# Patient Record
Sex: Female | Born: 1960 | Hispanic: No | Marital: Married | Smoking: Never smoker
Health system: Southern US, Community
[De-identification: ages and names within clinical notes are randomized; demographics above are authoritative.]

## PROBLEM LIST (undated history)

## (undated) DIAGNOSIS — H409 Unspecified glaucoma: Secondary | ICD-10-CM

## (undated) DIAGNOSIS — I10 Essential (primary) hypertension: Secondary | ICD-10-CM

## (undated) DIAGNOSIS — I429 Cardiomyopathy, unspecified: Secondary | ICD-10-CM

## (undated) DIAGNOSIS — I509 Heart failure, unspecified: Secondary | ICD-10-CM

## (undated) DIAGNOSIS — Z8616 Personal history of COVID-19: Secondary | ICD-10-CM

## (undated) DIAGNOSIS — J45909 Unspecified asthma, uncomplicated: Secondary | ICD-10-CM

## (undated) DIAGNOSIS — G43909 Migraine, unspecified, not intractable, without status migrainosus: Secondary | ICD-10-CM

## (undated) HISTORY — DX: Essential (primary) hypertension: I10

## (undated) HISTORY — DX: Personal history of COVID-19: Z86.16

## (undated) HISTORY — DX: Heart failure, unspecified: I50.9

## (undated) HISTORY — DX: Migraine, unspecified, not intractable, without status migrainosus: G43.909

## (undated) HISTORY — DX: Cardiomyopathy, unspecified: I42.9

## (undated) HISTORY — PX: EYE SURGERY: SHX253

## (undated) HISTORY — DX: Unspecified glaucoma: H40.9

---

## 2000-06-11 ENCOUNTER — Other Ambulatory Visit: Admission: RE | Admit: 2000-06-11 | Discharge: 2000-06-11 | Payer: Self-pay | Admitting: *Deleted

## 2011-08-03 ENCOUNTER — Ambulatory Visit (INDEPENDENT_AMBULATORY_CARE_PROVIDER_SITE_OTHER): Payer: 59 | Admitting: Family Medicine

## 2011-08-03 VITALS — BP 109/71 | HR 73 | Temp 98.0°F | Resp 16

## 2011-08-03 DIAGNOSIS — M2550 Pain in unspecified joint: Secondary | ICD-10-CM

## 2011-08-03 DIAGNOSIS — M6740 Ganglion, unspecified site: Secondary | ICD-10-CM

## 2011-08-03 NOTE — Patient Instructions (Signed)
Recheck if the lesion does not go down. Let me know if any concern of infection.

## 2011-08-03 NOTE — Progress Notes (Signed)
Patient is here for me to recheck her footwear I removed the fluid from a ganglion cyst last year. it did okay for a while but has now come back and she wanted me to recheck it. It's not getting a lot of pain, but is just Bayer.  Objective: 1 CM ganglion cyst on the lateral aspect of left foot overlying the first metatarsal head.   Assessment: Recurrent ganglion cyst  Plan: Discussed treatment of this. Patient would like me to turn reaspirated.  Using sterile technique the lesion was numbed with 1% lidocaine. Aspiration was attempted using an 18-gauge needle. Despite several passes was not able to give any cystic fluid out, no definite feels like cyst and little bit of the gelatinous material trauma output on one of the sticks with the needle. Could not squeeze anything the whole. Injected with Depo-Medrol approximately 0.2 cc or 16 milligrams.  Patient tolerated the procedure well.  Let me know if it doesn't go down.

## 2011-08-15 ENCOUNTER — Ambulatory Visit (INDEPENDENT_AMBULATORY_CARE_PROVIDER_SITE_OTHER): Payer: 59 | Admitting: Physician Assistant

## 2011-08-15 VITALS — BP 131/86 | HR 67 | Temp 98.5°F | Resp 16 | Ht 63.25 in | Wt 187.2 lb

## 2011-08-15 DIAGNOSIS — G43909 Migraine, unspecified, not intractable, without status migrainosus: Secondary | ICD-10-CM

## 2011-08-15 DIAGNOSIS — R110 Nausea: Secondary | ICD-10-CM

## 2011-08-15 DIAGNOSIS — N920 Excessive and frequent menstruation with regular cycle: Secondary | ICD-10-CM

## 2011-08-15 LAB — POCT CBC
Granulocyte percent: 59.4 %G (ref 37–80)
HCT, POC: 39.8 % (ref 37.7–47.9)
Hemoglobin: 12.7 g/dL (ref 12.2–16.2)
Lymph, poc: 2.5 (ref 0.6–3.4)
MCH, POC: 29.5 pg (ref 27–31.2)
MCHC: 31.9 g/dL (ref 31.8–35.4)
MCV: 92.4 fL (ref 80–97)
MID (cbc): 0.5 (ref 0–0.9)
MPV: 8.6 fL (ref 0–99.8)
POC Granulocyte: 4.5 (ref 2–6.9)
POC LYMPH PERCENT: 33.3 %L (ref 10–50)
POC MID %: 7.3 %M (ref 0–12)
Platelet Count, POC: 380 10*3/uL (ref 142–424)
RBC: 4.31 M/uL (ref 4.04–5.48)
RDW, POC: 13.4 %
WBC: 7.5 10*3/uL (ref 4.6–10.2)

## 2011-08-15 MED ORDER — PROMETHAZINE HCL 12.5 MG PO TABS: 12.5000 mg | ORAL_TABLET | Freq: Three times a day (TID) | ORAL | Status: DC | PRN

## 2011-08-15 MED ORDER — KETOROLAC TROMETHAMINE 60 MG/2ML IM SOLN
60.0000 mg | Freq: Once | INTRAMUSCULAR | Status: AC
  Administered 2011-08-15: 60 mg via INTRAMUSCULAR

## 2011-08-15 NOTE — Progress Notes (Signed)
  Subjective:    Patient ID: Marie Porter, female    DOB: 1960-10-05, 51 y.o.   MRN: [id number]  HPI Patient complains of migraine headache since last night. Says this feels like a typical migraine, although she has not had one for "years." Admits to increased stress recently and says that could have attributed to this headache. Admits to photophobia, phonophobia, slight nausea and vomiting yesterday.  She took Aleve yesterday which helped and tylenol today.  In addition, she has had increasingly heavy periods. She has a history of heavy periods when she was younger, but has had normal periods for the last few years. This period started 3 days ago and is very heavy, so she is concerned about anemia as well.     Review of Systems  All other systems reviewed and are negative.       Objective:   Physical Exam  Constitutional: She is oriented to person, place, and time. She appears well-developed and well-nourished.  HENT:  Head: Normocephalic and atraumatic.  Right Ear: Hearing, tympanic membrane, external ear and ear canal normal.  Left Ear: Hearing, tympanic membrane, external ear and ear canal normal.  Eyes: Conjunctivae and EOM are normal. Pupils are equal, round, and reactive to light.  Neck: Normal range of motion.  Cardiovascular: Normal rate, regular rhythm and normal heart sounds.   Pulmonary/Chest: Effort normal and breath sounds normal.  Lymphadenopathy:    She has no cervical adenopathy.  Neurological: She is alert and oriented to person, place, and time. No cranial nerve deficit.  Psychiatric: She has a normal mood and affect. Her behavior is normal. Judgment and thought content normal.          Assessment & Plan:    1. Migraine Toradol given today. Take Phenergan as needed for nause  ketorolac (TORADOL) injection 60 mg  2. Menorrhagia  Recommend follow up with GYN to further evaluate and treat menorrhagia.  POCT CBC  3. Nausea

## 2011-09-20 ENCOUNTER — Ambulatory Visit (INDEPENDENT_AMBULATORY_CARE_PROVIDER_SITE_OTHER): Payer: 59 | Admitting: Emergency Medicine

## 2011-09-20 DIAGNOSIS — N959 Unspecified menopausal and perimenopausal disorder: Secondary | ICD-10-CM

## 2011-09-20 LAB — POCT URINE PREGNANCY: Preg Test, Ur: NEGATIVE

## 2011-09-21 NOTE — Progress Notes (Signed)
  Subjective:    Patient ID: Marie Porter, female    DOB: 1960-10-01, 51 y.o.   MRN: [id number]  HPI Missed period.  Never missed previously.  No complaints   Review of Systems As per HPI, otherwise negative.      Objective:   Physical Exam   GEN: WDWN, NAD, Non-toxic, Alert & Oriented x 3 HEENT: Atraumatic, Normocephalic.  Ears and Nose: No external deformity. EXTR: No clubbing/cyanosis/edema NEURO: Normal gait.  PSYCH: Normally interactive. Conversant. Not depressed or anxious appearing.  Calm demeanor.        Assessment & Plan:  Missed period HCG Negative pregnancy Refer to gyn

## 2011-10-12 ENCOUNTER — Encounter: Payer: Self-pay | Admitting: Family Medicine

## 2011-10-12 ENCOUNTER — Ambulatory Visit (INDEPENDENT_AMBULATORY_CARE_PROVIDER_SITE_OTHER): Payer: 59 | Admitting: Family Medicine

## 2011-10-12 VITALS — BP 110/70 | HR 60 | Temp 98.0°F | Resp 16 | Ht 62.0 in | Wt 180.0 lb

## 2011-10-12 DIAGNOSIS — M6740 Ganglion, unspecified site: Secondary | ICD-10-CM

## 2011-10-12 HISTORY — PX: GANGLION CYST EXCISION: SHX1691

## 2011-10-12 NOTE — Patient Instructions (Signed)
Return in 7-10 days to have sutures removed

## 2011-10-12 NOTE — Progress Notes (Signed)
Subjective: Patient has a recurrence of the ganglion cyst on her left foot. This has been aspirated and injected twice. This last time it lasted for 2 months. She needs to be bothered by it since it is come back and is causing her pressure does cover her foot.  Objective: 1 CM tight ganglion cyst on the lateral aspect of the left foot.  Assessment: Ganglion cyst  Plan Discussed aspiration or attempted excision of this. Says it has occurred several times. I think may be worth trying to excise the flank. It is difficult to excise skin and cyst completely, but at least we can disrupt the part of the capsule. She is agreeable to this.  Procedure note: Wound was prepped and anesthetized with 1% lidocaine. Incision was made over the cyst and gently dissected. Then sister bumped up into the incision, but could not get down underneath it. Finally the ruptured. A portion of capsule was removed. This was sent for pathology. About 2 cc of gelatinous material was expressed. Wound was closed with 6-0 Ethilon x3. She is to come back in about 10 days if the sutures removed. She is to let her know if there is any side effects insists on her foot.

## 2011-10-19 ENCOUNTER — Telehealth: Payer: Self-pay | Admitting: Radiology

## 2011-10-19 NOTE — Telephone Encounter (Signed)
Message copied by Caffie Damme on Thu Oct 19, 2011  1:07 PM ------      Message from: HOPPER, DAVID H      Created: Mon Oct 16, 2011  9:43 PM       Call Angie and tell her the pathology confirmed cyst tissue.  I hope I got enough it does not return.

## 2011-10-19 NOTE — Telephone Encounter (Signed)
I left mssg for her to call me back about pathology

## 2011-10-20 ENCOUNTER — Ambulatory Visit (INDEPENDENT_AMBULATORY_CARE_PROVIDER_SITE_OTHER): Payer: 59 | Admitting: Physician Assistant

## 2011-10-20 ENCOUNTER — Encounter: Payer: Self-pay | Admitting: Physician Assistant

## 2011-10-20 DIAGNOSIS — M6740 Ganglion, unspecified site: Secondary | ICD-10-CM

## 2011-10-20 NOTE — Progress Notes (Signed)
The patient presents for suture removal.  A ganglion cyst was removed from the lateral aspect of the left foot by Dr. Alwyn Ren about a week ago. Path report confirmed the diagnosis.  No problems or concerns.  Well-healed surgical wound.  Suture removal without incident.

## 2011-10-20 NOTE — Telephone Encounter (Signed)
Pt informed of results and voiced understanding

## 2012-05-07 ENCOUNTER — Ambulatory Visit (INDEPENDENT_AMBULATORY_CARE_PROVIDER_SITE_OTHER): Payer: 59 | Admitting: Family Medicine

## 2012-05-07 ENCOUNTER — Ambulatory Visit: Payer: 59

## 2012-05-07 DIAGNOSIS — M542 Cervicalgia: Secondary | ICD-10-CM
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
## 2012-05-11 ENCOUNTER — Ambulatory Visit (INDEPENDENT_AMBULATORY_CARE_PROVIDER_SITE_OTHER): Payer: 59 | Admitting: Physician Assistant

## 2012-05-11 ENCOUNTER — Encounter: Payer: Self-pay | Admitting: Physician Assistant

## 2012-05-11 VITALS — BP 128/79 | HR 73 | Temp 97.6°F | Resp 16 | Ht 63.0 in | Wt 178.2 lb

## 2012-05-11 DIAGNOSIS — G43909 Migraine, unspecified, not intractable, without status migrainosus: Secondary | ICD-10-CM

## 2012-05-11 MED ORDER — KETOROLAC TROMETHAMINE 60 MG/2ML IM SOLN
60.0000 mg | Freq: Once | INTRAMUSCULAR | Status: AC
  Administered 2012-05-11: 60 mg via INTRAMUSCULAR

## 2012-05-11 MED ORDER — ONDANSETRON 4 MG PO TBDP
4.0000 mg | ORAL_TABLET | Freq: Once | ORAL | Status: AC
  Administered 2012-05-11: 4 mg via ORAL
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
## 2012-05-11 NOTE — Patient Instructions (Addendum)
Migraine Headache A migraine headache is an intense, throbbing pain on one or both sides of your head. A migraine can last for 30 minutes to several hours. CAUSES  The exact cause of a migraine headache is not always known. However, a migraine may be caused when nerves in the brain become irritated and release chemicals that cause inflammation. This causes pain. SYMPTOMS  Pain on one or both sides of your head.  Pulsating or throbbing pain.  Severe pain that prevents daily activities.  Pain that is aggravated by any physical activity.  Nausea, vomiting, or both.  Dizziness.  Pain with exposure to bright lights, loud noises, or activity.  General sensitivity to bright lights, loud noises, or smells. Before you get a migraine, you may get warning signs that a migraine is coming (aura). An aura may include:  Seeing flashing lights.  Seeing bright spots, halos, or zig-zag lines.  Having tunnel vision or blurred vision.  Having feelings of numbness or tingling.  Having trouble talking.  Having muscle weakness. MIGRAINE TRIGGERS  Alcohol.  Smoking.  Stress.  Menstruation.  Aged cheeses.  Foods or drinks that contain nitrates, glutamate, aspartame, or tyramine.  Lack of sleep.  Chocolate.  Caffeine.  Hunger.  Physical exertion.  Fatigue.  Medicines used to treat chest pain (nitroglycerine), birth control pills, estrogen, and some blood pressure medicines. DIAGNOSIS  A migraine headache is often diagnosed based on:  Symptoms.  Physical examination.  A CT scan or MRI of your head. TREATMENT Medicines may be given for pain and nausea. Medicines can also be given to help prevent recurrent migraines.  HOME CARE INSTRUCTIONS  Only take over-the-counter or prescription medicines for pain or discomfort as directed by your caregiver. The use of long-term narcotics is not recommended.  Lie down in a dark, quiet room when you have a migraine.  Keep a journal  to find out what may trigger your migraine headaches. For example, write down:  What you eat and drink.  How much sleep you get.  Any change to your diet or medicines.  Limit alcohol consumption.  Quit smoking if you smoke.  Get 7 to 9 hours of sleep, or as recommended by your caregiver.  Limit stress.  Keep lights dim if bright lights bother you and make your migraines worse. SEEK IMMEDIATE MEDICAL CARE IF:   Your migraine becomes severe.  You have a fever.  You have a stiff neck.  You have vision loss.  You have muscular weakness or loss of muscle control.  You start losing your balance or have trouble walking.  You feel faint or pass out.  You have severe symptoms that are different from your first symptoms. MAKE SURE YOU:   Understand these instructions.  Will watch your condition.  Will get help right away if you are not doing well or get worse. Document Released: 02/06/2005 Document Revised: 05/01/2011 Document Reviewed: 01/27/2011 [employer] Patient Information 2013 [employer].  

## 2012-08-13 ENCOUNTER — Ambulatory Visit (INDEPENDENT_AMBULATORY_CARE_PROVIDER_SITE_OTHER): Payer: 59 | Admitting: Physician Assistant

## 2012-08-13 VITALS — BP 129/74 | HR 80 | Temp 97.7°F | Resp 18 | Ht 63.0 in | Wt 184.0 lb

## 2012-08-13 DIAGNOSIS — R109 Unspecified abdominal pain: Secondary | ICD-10-CM

## 2012-08-13 DIAGNOSIS — R635 Abnormal weight gain: Secondary | ICD-10-CM

## 2012-08-13 LAB — POCT URINALYSIS DIPSTICK
Bilirubin, UA: NEGATIVE
Blood, UA: NEGATIVE
Glucose, UA: NEGATIVE
Spec Grav, UA: 1.025

## 2012-08-13 LAB — POCT CBC
Granulocyte percent: 67.6 % (ref 37–80)
HCT, POC: 39 % (ref 37.7–47.9)
Hemoglobin: 12.4 g/dL (ref 12.2–16.2)
Lymph, poc: 2.2 (ref 0.6–3.4)
MCH, POC: 30.8 pg (ref 27–31.2)
MCHC: 31.8 g/dL (ref 31.8–35.4)
MCV: 96.7 fL (ref 80–97)
MID (cbc): 0.6 (ref 0–0.9)
MPV: 9.1 fL (ref 0–99.8)
POC Granulocyte: 5.8 (ref 2–6.9)
POC LYMPH PERCENT: 25.4 % (ref 10–50)
POC MID %: 7 % (ref 0–12)
Platelet Count, POC: 305 10*3/uL (ref 142–424)
RBC: 4.03 M/uL — AB (ref 4.04–5.48)
RDW, POC: 13.7 %
WBC: 8.6 10*3/uL (ref 4.6–10.2)

## 2012-08-13 LAB — POCT UA - MICROSCOPIC ONLY
Mucus, UA: NEGATIVE
RBC, urine, microscopic: NEGATIVE
WBC, Ur, HPF, POC: NEGATIVE

## 2012-08-13 LAB — COMPREHENSIVE METABOLIC PANEL
ALT: 15 U/L (ref 0–35)
Albumin: 3.7 g/dL (ref 3.5–5.2)
Alkaline Phosphatase: 74 U/L (ref 39–117)
CO2: 26 mEq/L (ref 19–32)
Glucose, Bld: 79 mg/dL (ref 70–99)
Potassium: 3.6 mEq/L (ref 3.5–5.3)
Sodium: 139 mEq/L (ref 135–145)
Total Protein: 6.2 g/dL (ref 6.0–8.3)

## 2012-08-13 LAB — POCT URINE PREGNANCY: Preg Test, Ur: NEGATIVE

## 2012-08-13 NOTE — Progress Notes (Signed)
Patient ID: Marie Porter MRN: [id number], DOB: [birth year], 52 y.o. Date of Encounter: 08/13/2012, 12:21 PM  Primary Physician: No primary provider on file.  Chief Complaint: Abdominal pain x 2 weeks  HPI: 52 y.o. female with history below presents with generalized abdominal pain and cramping x 2 weeks. Pain is constant dull ache described as a cramp. She states it is "box like over the entire abdomen." Pain is greatest along the mid line. She states nothing makes it worse and nothing makes it better. States it is not improved or worsened with food. It is rated a 5 or 6/10, and will go up to a 7 or 8/10 lasting 5-10 minutes before going back down to the baseline of 5-6/10. She has been afebrile throughout. She was due for her period in mid June and wonders if this could be secondary to perimenopause. Her husband is gone most of the time secondary to work and states she is not pregnant. Some associated nausea without emesis. No diarrhea or constipation. She will have 2-3 regular formed BM's per day and that is baseline for her throughout her life. No urinary symptoms. No vaginal symptoms outside of the lack of her period that was due in mid June. She states she has had some cramping into her back which is typical.   No past medical history on file.   Home Meds: Prior to Admission medications   Medication Sig Start Date End Date Taking? Authorizing Provider  Naproxen Sodium (ALEVE PO) Take by mouth.   Yes Historical Provider, MD  promethazine (PHENERGAN) 12.5 MG tablet Take 1 tablet (12.5 mg total) by mouth every 8 (eight) hours as needed for nausea. 08/15/11 08/22/11  Nelva Nay, PA-C    Allergies: No Known Allergies  History   Social History  . Marital Status: Unknown    Spouse Name: N/A    Number of Children: N/A  . Years of Education: N/A   Occupational History  . Not on file.   Social History Main Topics  . Smoking status: Never Smoker   . Smokeless tobacco: Not on file    . Alcohol Use: 0.6 oz/week    1 Glasses of wine per week  . Drug Use: No  . Sexually Active: Yes   Other Topics Concern  . Not on file   Social History Narrative  . No narrative on file     Review of Systems: Constitutional: positive for fatigue. negative for chills, or fever  HEENT: negative for vision changes, hearing loss, congestion, rhinorrhea, ST, epistaxis, or sinus pressure Cardiovascular: negative for chest pain, chest tightness, DOE, edema, or palpitations Respiratory: negative for hemoptysis, wheezing, shortness of breath, or cough Abdominal: see above Genitourinary: negative for dysuria, urinary frequency, or urinary urgency  Vaginal: negative for abnormal bleeding, burning, or discharge Dermatological: negative for rash Neurologic: negative for headache, dizziness, or syncope   Physical Exam: Blood pressure 129/74, pulse 80, temperature 97.7 F (36.5 C), resp. rate 18, height 5\' 3"  (1.6 m), weight 184 lb (83.462 kg), last menstrual period 07/04/2012., Body mass index is 32.6 kg/(m^2). General: Well developed, well nourished, in no acute distress. Well appearing.  Head: Normocephalic, atraumatic, eyes without discharge, sclera non-icteric, nares are without discharge. Bilateral auditory canals clear, TM's are without perforation, pearly grey and translucent with reflective cone of light bilaterally. Oral cavity moist, posterior pharynx without exudate, erythema, peritonsillar abscess, or post nasal drip.  Neck: Supple. No thyromegaly. Full ROM. No lymphadenopathy. Lungs: Clear bilaterally to auscultation without  wheezes, rales, or rhonchi. Breathing is unlabored. Heart: RRR with S1 S2. No murmurs, rubs, or gallops appreciated. Abdomen: Soft, non-distended with normoactive bowel sounds. Mild diffuse TTP with greatest TTP along RUQ and RLQ, although this was still mild. No hepatosplenomegaly. No rebound/guarding. No obvious abdominal masses. Negative McBurney's. Negative  table jar test.  Msk:  Strength and tone normal for age. Extremities/Skin: Warm and dry. No clubbing or cyanosis. No edema. No rashes or suspicious lesions. Neuro: Alert and oriented X 3. Moves all extremities spontaneously. Gait is normal. CNII-XII grossly in tact. Psych:  Responds to questions appropriately with a normal affect.   Labs: Results for orders placed in visit on 08/13/12  POCT CBC      Result Value Range   WBC 8.6  4.6 - 10.2 K/uL   Lymph, poc 2.2  0.6 - 3.4   POC LYMPH PERCENT 25.4  10 - 50 %L   MID (cbc) 0.6  0 - 0.9   POC MID % 7.0  0 - 12 %M   POC Granulocyte 5.8  2 - 6.9   Granulocyte percent 67.6  37 - 80 %G   RBC 4.03 (*) 4.04 - 5.48 M/uL   Hemoglobin 12.4  12.2 - 16.2 g/dL   HCT, POC 13.0  86.5 - 47.9 %   MCV 96.7  80 - 97 fL   MCH, POC 30.8  27 - 31.2 pg   MCHC 31.8  31.8 - 35.4 g/dL   RDW, POC 78.4     Platelet Count, POC 305  142 - 424 K/uL   MPV 9.1  0 - 99.8 fL  POCT UA - MICROSCOPIC ONLY      Result Value Range   WBC, Ur, HPF, POC neg     RBC, urine, microscopic neg     Bacteria, U Microscopic 1+     Mucus, UA neg     Epithelial cells, urine per micros 2-5     Crystals, Ur, HPF, POC neg     Casts, Ur, LPF, POC neg     Yeast, UA neg    POCT URINALYSIS DIPSTICK      Result Value Range   Color, UA yellow     Clarity, UA cloudy     Glucose, UA neg     Bilirubin, UA neg     Ketones, UA neg     Spec Grav, UA 1.025     Blood, UA neg     pH, UA 6.0     Protein, UA neg     Urobilinogen, UA 0.2     Nitrite, UA neg     Leukocytes, UA Negative    POCT URINE PREGNANCY      Result Value Range   Preg Test, Ur Negative      CMP, TSH, FSH, and LH all pending  ASSESSMENT AND PLAN:  52 y.o. female with abdominal cramping likely secondary to perimenopause  -Motrin  -Tylenol  -Prilosec OTC -Probiotics -Await labs -Will hold on imaging at this time given symptom duration, exam, and lab findings. If symptoms persist will proceed with imaging.    -Discussed with Dr. Milus Glazier   Signed, Eula Listen, PA-C 08/13/2012 12:21 PM

## 2012-08-14 LAB — FOLLICLE STIMULATING HORMONE: FSH: 3.8 m[IU]/mL

## 2012-08-14 LAB — LUTEINIZING HORMONE: LH: 20.9 m[IU]/mL

## 2012-12-31 ENCOUNTER — Encounter: Payer: Self-pay | Admitting: Physician Assistant

## 2012-12-31 ENCOUNTER — Ambulatory Visit (INDEPENDENT_AMBULATORY_CARE_PROVIDER_SITE_OTHER): Payer: 59 | Admitting: Physician Assistant

## 2012-12-31 VITALS — BP 114/70 | HR 76 | Temp 98.0°F | Resp 18 | Ht 62.5 in | Wt 174.0 lb

## 2012-12-31 DIAGNOSIS — Z1239 Encounter for other screening for malignant neoplasm of breast: Secondary | ICD-10-CM

## 2012-12-31 DIAGNOSIS — Z0000 Encounter for general adult medical examination without abnormal findings: Secondary | ICD-10-CM

## 2012-12-31 DIAGNOSIS — Z1211 Encounter for screening for malignant neoplasm of colon: Secondary | ICD-10-CM

## 2012-12-31 DIAGNOSIS — G43909 Migraine, unspecified, not intractable, without status migrainosus: Secondary | ICD-10-CM | POA: Insufficient documentation

## 2012-12-31 DIAGNOSIS — Z124 Encounter for screening for malignant neoplasm of cervix: Secondary | ICD-10-CM

## 2012-12-31 LAB — LIPID PANEL
Cholesterol: 128 mg/dL (ref 0–200)
LDL Cholesterol: 67 mg/dL (ref 0–99)
Total CHOL/HDL Ratio: 2.6 Ratio
VLDL: 11 mg/dL (ref 0–40)

## 2012-12-31 LAB — POCT URINALYSIS DIPSTICK
Ketones, UA: NEGATIVE
Leukocytes, UA: NEGATIVE
Nitrite, UA: NEGATIVE
Protein, UA: NEGATIVE
Urobilinogen, UA: 0.2
pH, UA: 7

## 2012-12-31 LAB — POCT CBC
Granulocyte percent: 71.4 %G (ref 37–80)
HCT, POC: 41 % (ref 37.7–47.9)
MCHC: 32 g/dL (ref 31.8–35.4)
MPV: 8.7 fL (ref 0–99.8)
POC Granulocyte: 5.4 (ref 2–6.9)
POC LYMPH PERCENT: 23.5 %L (ref 10–50)
POC MID %: 5.1 %M (ref 0–12)
RDW, POC: 13.3 %

## 2012-12-31 LAB — COMPREHENSIVE METABOLIC PANEL
ALT: 14 U/L (ref 0–35)
AST: 15 U/L (ref 0–37)
Alkaline Phosphatase: 69 U/L (ref 39–117)
Creat: 0.68 mg/dL (ref 0.50–1.10)
Sodium: 141 mEq/L (ref 135–145)
Total Bilirubin: 0.5 mg/dL (ref 0.3–1.2)
Total Protein: 6.5 g/dL (ref 6.0–8.3)

## 2012-12-31 NOTE — Patient Instructions (Addendum)
I will contact you with your lab results as soon as they are available.   If you have not heard from me in 2 weeks, please contact me.  The fastest way to get your results is to register for My Chart (see the instructions on the last page of this printout).  Keeping You Healthy  Get These Tests  Blood Pressure- Have your blood pressure checked by your healthcare provider at least once a year.  Normal blood pressure is 120/80.  Weight- Have your body mass index (BMI) calculated to screen for obesity.  BMI is a measure of body fat based on height and weight.  You can calculate your own BMI at www.nhlbisupport.com/bmi/  Cholesterol- Have your cholesterol checked every year.  Diabetes- Have your blood sugar checked every year if you have high blood pressure, high cholesterol, a family history of diabetes or if you are overweight.  Pap Smear- Have a pap smear every 1 to 3 years if you have been sexually active.  If you are older than 65 and recent pap smears have been normal you may not need additional pap smears.  In addition, if you have had a hysterectomy  For benign disease additional pap smears are not necessary.  Mammogram-Yearly mammograms are essential for early detection of breast cancer  Screening for Colon Cancer- Colonoscopy starting at age 50. Screening may begin sooner depending on your family history and other health conditions.  Follow up colonoscopy as directed by your Gastroenterologist.  Screening for Osteoporosis- Screening begins at age 65 with bone density scanning, sooner if you are at higher risk for developing Osteoporosis.  Get these medicines  Calcium with Vitamin D- Your body requires 1200-1500 mg of Calcium a day and 800-1000 IU of Vitamin D a day.  You can only absorb 500 mg of Calcium at a time therefore Calcium must be taken in 2 or 3 separate doses throughout the day.  Hormones- Hormone therapy has been associated with increased risk for certain cancers and  heart disease.  Talk to your healthcare provider about if you need relief from menopausal symptoms.  Aspirin- Ask your healthcare provider about taking Aspirin to prevent Heart Disease and Stroke.  Get these Immuniztions  Flu shot- Every fall  Pneumonia shot- Once after the age of 65; if you are younger ask your healthcare provider if you need a pneumonia shot.  Tetanus- Every ten years.  Zostavax- Once after the age of 60 to prevent shingles.  Take these steps  Don't smoke- Your healthcare provider can help you quit. For tips on how to quit, ask your healthcare provider or go to www.smokefree.gov or call 1-800 QUIT-NOW.  Be physically active- Exercise 5 days a week for a minimum of 30 minutes.  If you are not already physically active, start slow and gradually work up to 30 minutes of moderate physical activity.  Try walking, dancing, bike riding, swimming, etc.  Eat a healthy diet- Eat a variety of healthy foods such as fruits, vegetables, whole grains, low fat milk, low fat cheeses, yogurt, lean meats, chicken, fish, eggs, dried beans, tofu, etc.  For more information go to www.thenutritionsource.org  Dental visit- Brush and floss teeth twice daily; visit your dentist twice a year.  Eye exam- Visit your Optometrist or Ophthalmologist yearly.  Drink alcohol in moderation- Limit alcohol intake to one drink or less a day.  Never drink and drive.  Depression- Your emotional health is as important as your physical health.  If you're feeling   down or losing interest in things you normally enjoy, please talk to your healthcare provider.  Seat Belts- can save your life; always wear one  Smoke/Carbon Monoxide detectors- These detectors need to be installed on the appropriate level of your home.  Replace batteries at least once a year.  Violence- If anyone is threatening or hurting you, please tell your healthcare provider.  Living Will/ Health care power of attorney- Discuss with your  healthcare provider and family. 

## 2012-12-31 NOTE — Progress Notes (Signed)
Subjective:    Patient ID: Marie Porter, female    DOB: [birth year], 52 y.o.   MRN: [id number]  HPI  Marie Porter is a 52 YO female who presents for annual physical exam.  She has a history of migraines and glaucoma but desires to be up to date with health screening tests such as mammography, pap smear, and colonoscopy.  The patient has never had a colonoscopy or mammogram and is interested in scheduling these, but denies any symptoms including rectal bleeding, hemorrhoids, bloody stools, abdominal pain, breast masses, nipple discharge, or changes in her breasts. She states she consistently does self breast exams and has never found any lumps. She has had 3-4 pap smears in her 20's that have always been unremarkable but has not had a pap smear since this time.  She denies any vaginal symptoms including vaginal discharge, pain, or irritation. She is currently up to date with her flu vaccine and TDAp. She currently sees her dentist every 6 months.   She denies any use of tobacco and drinks alcohol socially one time/month.  She currently tries to limit carbohydrates and soda from her diet.  She walks/runs a total of 30 minutes 3 times/week.  She is currently sexually active with one lifelong partner-her husband.    Her PMH include glaucoma and migraine.  Her last migraine was 05/11/12 and states they have been hormonally triggered.  She is typically treated with Toradol injections that quickly resolve her pain.  She states she is currently perimenopausal and went 6 months last year without a period, and has currently gone 3 months without a period.    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  She currently sees her eye doctor annually and has no problems with her visual acuity.   The patient also has a history of a ganglion cyst on the lateral aspect of her left foot that was excised/removed on 10/12/11 and has not had any recurrence, pain,  swelling in the area.       Past Medical History  Diagnosis Date  . Glaucoma   . Migraine    Family History  Problem Relation Age of Onset  . Hypertension Mother   . Stroke Mother   . Seizures Mother   . Cancer Father     brain  . Seizures Father 64    secondary to brain cancer  . Stroke Maternal Grandmother   . Hypertension Maternal Grandmother   . Diabetes Maternal Grandfather   . Heart disease Paternal Grandmother   . Pneumonia Paternal Grandfather     Review of Systems  Constitutional: Negative for fever, chills, activity change, appetite change and fatigue.  HENT: Negative for congestion, ear pain, hearing loss, postnasal drip, rhinorrhea, sinus pressure, sneezing, sore throat and tinnitus.   Eyes: Negative for pain and visual disturbance.  Respiratory: Negative for cough, chest tightness, shortness of breath and wheezing.   Cardiovascular: Negative for chest pain, palpitations and leg swelling.  Gastrointestinal: Negative for nausea, vomiting, abdominal pain, diarrhea, constipation, blood in stool and abdominal distention.  Endocrine: Negative for cold intolerance and heat intolerance.  Genitourinary: Negative for dysuria, frequency, vaginal bleeding, vaginal discharge and vaginal pain.  Musculoskeletal: Negative for gait problem and myalgias.  Skin: Negative for color change, rash and wound.  Neurological: Negative for dizziness, seizures, syncope, weakness, light-headedness and numbness.  Psychiatric/Behavioral: The patient is not nervous/anxious.        Objective:   Physical Exam  Marie Porter  is a pleasant well-developed, well-nourished Caucasian female  in no acute distress Heart: Normal rate and rhythm, no murmurs, rubs, or gallops Lungs: Clear to auscultation bilaterally, no wheezing, rhonchi, or rales Extremities: No cyanosis or edema noted Abdomen: soft and non-tender, no masses or hepatosplenomegaly Neuro: Alert and oriented.  CN II-XII grossly  intact.  DTR's intact and equal bilaterally.  EENT: External ear and ear canals clear with no erythema noted and TM visible bilaterally.  Nasal mucosa non-erythematous and non-swollen.  Oral pharynx non-erythematous with no tonsillar swelling or exudate.  Neck supple with no lymphadenopathy present with thyroid of equal size and rises symmetrically upon swallowing.  Frontal sinuses non-tender to palpation.  Skin: warm and pink without lesions.  G/U: Vagina and labia without rash, tenderness, lesions, or discharge noted.  No lymphadenopathy noted.   Breast: symmetric with no swelling, nipple discharge, or tenderness upon palpation.  No masses noted.    BP 114/70  Pulse 76  Temp(Src) 98 F (36.7 C) (Oral)  Resp 18  Ht 5' 2.5" (1.588 m)  Wt 174 lb (78.926 kg)  BMI 31.30 kg/m2  SpO2 99%  LMP 11/25/2012   Results for orders placed in visit on 12/31/12  COMPREHENSIVE METABOLIC PANEL      Result Value Range   Sodium 141  135 - 145 mEq/L   Potassium 4.1  3.5 - 5.3 mEq/L   Chloride 105  96 - 112 mEq/L   CO2 26  19 - 32 mEq/L   Glucose, Bld 82  70 - 99 mg/dL   BUN 10  6 - 23 mg/dL   Creat 1.61  0.96 - 0.45 mg/dL   Total Bilirubin 0.5  0.3 - 1.2 mg/dL   Alkaline Phosphatase 69  39 - 117 U/L   AST 15  0 - 37 U/L   ALT 14  0 - 35 U/L   Total Protein 6.5  6.0 - 8.3 g/dL   Albumin 3.8  3.5 - 5.2 g/dL   Calcium 8.9  8.4 - 40.9 mg/dL  LIPID PANEL      Result Value Range   Cholesterol 128  0 - 200 mg/dL   Triglycerides 56  <811 mg/dL   HDL 50  >91 mg/dL   Total CHOL/HDL Ratio 2.6     VLDL 11  0 - 40 mg/dL   LDL Cholesterol 67  0 - 99 mg/dL  TSH      Result Value Range   TSH 1.382  0.350 - 4.500 uIU/mL  VITAMIN D 25 HYDROXY      Result Value Range   Vit D, 25-Hydroxy    30 - 89 ng/mL  POCT CBC      Result Value Range   WBC 7.5  4.6 - 10.2 K/uL   Lymph, poc 1.8  0.6 - 3.4   POC LYMPH PERCENT 23.5  10 - 50 %L   MID (cbc) 0.4  0 - 0.9   POC MID % 5.1  0 - 12 %M   POC Granulocyte 5.4   2 - 6.9   Granulocyte percent 71.4  37 - 80 %G   RBC 4.23  4.04 - 5.48 M/uL   Hemoglobin 13.1  12.2 - 16.2 g/dL   HCT, POC 47.8  29.5 - 47.9 %   MCV 96.9  80 - 97 fL   MCH, POC 31.0  27 - 31.2 pg   MCHC 32.0  31.8 - 35.4 g/dL   RDW, POC 13.3  Platelet Count, POC 300  142 - 424 K/uL   MPV 8.7  0 - 99.8 fL  POCT URINALYSIS DIPSTICK      Result Value Range   Color, UA yellow     Clarity, UA clear     Glucose, UA neg     Bilirubin, UA neg     Ketones, UA neg     Spec Grav, UA 1.025     Blood, UA neg     pH, UA 7.0     Protein, UA neg     Urobilinogen, UA 0.2     Nitrite, UA neg     Leukocytes, UA Negative          Assessment & Plan:  Annual physical exam - Plan: POCT CBC, POCT urinalysis dipstick, Comprehensive metabolic panel, Lipid panel, TSH, Vit D  25 hydroxy (rtn osteoporosis monitoring).  Continue healthy lifestyle choices including 150 minutes of physical activity/week, low fat diet with plenty of fruits and vegetables, and at least 64 ounces of water/day.    Screening for cervical cancer - Plan: Pap IG and HPV (high risk) DNA detection  Screening for breast cancer - Plan: MM Digital Screening scheduled  Screening for colon cancer - Plan: Ambulatory referral to Gastroenterology   Patient Instructions  I will contact you with your lab results as soon as they are available.   If you have not heard from me in 2 weeks, please contact me.  The fastest way to get your results is to register for My Chart (see the instructions on the last page of this printout).  Keeping You Healthy  Get These Tests  Blood Pressure- Have your blood pressure checked by your healthcare provider at least once a year.  Normal blood pressure is 120/80.  Weight- Have your body mass index (BMI) calculated to screen for obesity.  BMI is a measure of body fat based on height and weight.  You can calculate your own BMI at https://www.west-esparza.com/  Cholesterol- Have your cholesterol checked  every year.  Diabetes- Have your blood sugar checked every year if you have high blood pressure, high cholesterol, a family history of diabetes or if you are overweight.  Pap Smear- Have a pap smear every 1 to 3 years if you have been sexually active.  If you are older than 65 and recent pap smears have been normal you may not need additional pap smears.  In addition, if you have had a hysterectomy  For benign disease additional pap smears are not necessary.  Mammogram-Yearly mammograms are essential for early detection of breast cancer  Screening for Colon Cancer- Colonoscopy starting at age 62. Screening may begin sooner depending on your family history and other health conditions.  Follow up colonoscopy as directed by your Gastroenterologist.  Screening for Osteoporosis- Screening begins at age 2 with bone density scanning, sooner if you are at higher risk for developing Osteoporosis.  Get these medicines  Calcium with Vitamin D- Your body requires 1200-1500 mg of Calcium a day and [phone] IU of Vitamin D a day.  You can only absorb 500 mg of Calcium at a time therefore Calcium must be taken in 2 or 3 separate doses throughout the day.  Hormones- Hormone therapy has been associated with increased risk for certain cancers and heart disease.  Talk to your healthcare provider about if you need relief from menopausal symptoms.  Aspirin- Ask your healthcare provider about taking Aspirin to prevent Heart Disease and Stroke.  Get these  Immuniztions  Flu shot- Every fall  Pneumonia shot- Once after the age of 57; if you are younger ask your healthcare provider if you need a pneumonia shot.  Tetanus- Every ten years.  Zostavax- Once after the age of 58 to prevent shingles.  Take these steps  Don't smoke- Your healthcare provider can help you quit. For tips on how to quit, ask your healthcare provider or go to www.smokefree.gov or call 1-800 QUIT-NOW.  Be physically active- Exercise 5  days a week for a minimum of 30 minutes.  If you are not already physically active, start slow and gradually work up to 30 minutes of moderate physical activity.  Try walking, dancing, bike riding, swimming, etc.  Eat a healthy diet- Eat a variety of healthy foods such as fruits, vegetables, whole grains, low fat milk, low fat cheeses, yogurt, lean meats, chicken, fish, eggs, dried beans, tofu, etc.  For more information go to www.thenutritionsource.org  Dental visit- Brush and floss teeth twice daily; visit your dentist twice a year.  Eye exam- Visit your Optometrist or Ophthalmologist yearly.  Drink alcohol in moderation- Limit alcohol intake to one drink or less a day.  Never drink and drive.  Depression- Your emotional health is as important as your physical health.  If you're feeling down or losing interest in things you normally enjoy, please talk to your healthcare provider.  Seat Belts- can save your life; always wear one  Smoke/Carbon Monoxide detectors- These detectors need to be installed on the appropriate level of your home.  Replace batteries at least once a year.  Violence- If anyone is threatening or hurting you, please tell your healthcare provider.  Living Will/ Health care power of attorney- Discuss with your healthcare provider and family.

## 2013-01-01 LAB — VITAMIN D 25 HYDROXY (VIT D DEFICIENCY, FRACTURES): Vit D, 25-Hydroxy: 15 ng/mL — ABNORMAL LOW (ref 30–89)

## 2013-01-02 ENCOUNTER — Other Ambulatory Visit: Payer: Self-pay | Admitting: Physician Assistant

## 2013-01-02 LAB — PAP IG AND HPV HIGH-RISK: HPV DNA High Risk: NOT DETECTED

## 2013-01-02 MED ORDER — VITAMIN D (ERGOCALCIFEROL) 1.25 MG (50000 UNIT) PO CAPS: 50000.0000 [IU] | ORAL_CAPSULE | ORAL | Status: DC

## 2013-01-02 NOTE — Progress Notes (Signed)
I have examined this patient along with the student and agree.  

## 2013-01-03 ENCOUNTER — Encounter: Payer: Self-pay | Admitting: Physician Assistant

## 2013-01-10 ENCOUNTER — Ambulatory Visit (INDEPENDENT_AMBULATORY_CARE_PROVIDER_SITE_OTHER): Payer: 59 | Admitting: Physician Assistant

## 2013-01-10 ENCOUNTER — Other Ambulatory Visit: Payer: Self-pay | Admitting: Physician Assistant

## 2013-01-10 ENCOUNTER — Ambulatory Visit (HOSPITAL_COMMUNITY)
Admission: RE | Admit: 2013-01-10 | Discharge: 2013-01-10 | Disposition: A | Discharge status: Home / Self Care | Payer: 59 | Source: Ambulatory Visit | Attending: Physician Assistant | Admitting: Physician Assistant

## 2013-01-10 ENCOUNTER — Encounter (HOSPITAL_COMMUNITY): Payer: Self-pay

## 2013-01-10 VITALS — BP 146/90 | HR 79 | Temp 98.1°F | Resp 16 | Ht 62.5 in | Wt 174.4 lb

## 2013-01-10 DIAGNOSIS — N951 Menopausal and female climacteric states: Secondary | ICD-10-CM

## 2013-01-10 DIAGNOSIS — R109 Unspecified abdominal pain: Secondary | ICD-10-CM

## 2013-01-10 DIAGNOSIS — R110 Nausea: Secondary | ICD-10-CM | POA: Insufficient documentation

## 2013-01-10 LAB — POCT CBC
Granulocyte percent: 64.7 %G (ref 37–80)
Hemoglobin: 13.2 g/dL (ref 12.2–16.2)
MID (cbc): 0.4 (ref 0–0.9)
MPV: 9.1 fL (ref 0–99.8)
POC Granulocyte: 4.5 (ref 2–6.9)
POC MID %: 5.8 %M (ref 0–12)
Platelet Count, POC: 285 10*3/uL (ref 142–424)
RBC: 4.32 M/uL (ref 4.04–5.48)
WBC: 7 10*3/uL (ref 4.6–10.2)

## 2013-01-10 LAB — POCT UA - MICROSCOPIC ONLY: Mucus, UA: POSITIVE

## 2013-01-10 LAB — POCT URINALYSIS DIPSTICK
Bilirubin, UA: NEGATIVE
Blood, UA: NEGATIVE
Glucose, UA: NEGATIVE
Ketones, UA: NEGATIVE
Spec Grav, UA: 1.025

## 2013-01-10 MED ORDER — IOHEXOL 300 MG/ML  SOLN
100.0000 mL | Freq: Once | INTRAMUSCULAR | Status: AC | PRN
  Administered 2013-01-10: 100 mL via INTRAVENOUS

## 2013-01-10 MED ORDER — DICYCLOMINE HCL 20 MG PO TABS: 20.0000 mg | ORAL_TABLET | Freq: Four times a day (QID) | ORAL | Status: DC

## 2013-01-10 NOTE — Patient Instructions (Addendum)
Your CT scan is scheduled for today. You drink contrast #1 at 11am #2 at 12pm, then arrive for the scan at [facility] at 12:45, the scan is scheduled for 1pm. No solid foods you may have clear liquids.   Abdominal Pain Abdominal pain can be caused by many things. Your caregiver decides the seriousness of your pain by an examination and possibly blood tests and X-rays. Many cases can be observed and treated at home. Most abdominal pain is not caused by a disease and will probably improve without treatment. However, in many cases, more time must pass before a clear cause of the pain can be found. Before that point, it may not be known if you need more testing, or if hospitalization or surgery is needed. HOME CARE INSTRUCTIONS   Do not take laxatives unless directed by your caregiver.  Take pain medicine only as directed by your caregiver.  Only take over-the-counter or prescription medicines for pain, discomfort, or fever as directed by your caregiver.  Try a clear liquid diet (broth, tea, or water) for as long as directed by your caregiver. Slowly move to a bland diet as tolerated. SEEK IMMEDIATE MEDICAL CARE IF:   The pain does not go away.  You have a fever.  You keep throwing up (vomiting).  The pain is felt only in portions of the abdomen. Pain in the right side could possibly be appendicitis. In an adult, pain in the left lower portion of the abdomen could be colitis or diverticulitis.  You pass bloody or black tarry stools. MAKE SURE YOU:   Understand these instructions.  Will watch your condition.  Will get help right away if you are not doing well or get worse. Document Released: 11/16/2004 Document Revised: 05/01/2011 Document Reviewed: 09/25/2007 [facility] Patient Information 2014 [place], [place].

## 2013-01-10 NOTE — Addendum Note (Signed)
Addended by: Johnnette Litter on: 01/10/2013 04:29 PM   Modules accepted: Orders

## 2013-01-10 NOTE — Progress Notes (Signed)
Patient ID: Marie Porter MRN: [id number], DOB: [birth year], 52 y.o. Date of Encounter: 01/10/2013, 8:45 AM  Primary Physician: No primary provider on file.  Chief Complaint: Abdominal pain x this AM  HPI: 52 y.o. female with history below presents with onset of abdominal pain this morning at 5 AM. Patient states she was reading her bible and developed severe abdominal pain"in my gut." States the pain is "in a box" over her lower abdomen. She immediately felt the urge to have a BM. This BM was well formed. During this BM her abdominal pain did ease up but was replaced with nausea. She denies any constipation or diarrhea. She states within minutes of finishing the BM her abdominal pain returned. She rode to work with her husband and again had another episode of feeling like she must have a BM right now. She told him to pull over a [place] just down the rode from [facility]. She then decided to go on to [facility] and had a second BM. This BM also was well formed. It also relieved her abdominal pain during the passage, but by the time she was washing her hands the abdominal pain had returned. She then again had a third BM this morning repeating the above. She currently has moderate to severe abdominal pain from the umbilicus inferiorly radiating to the left and right. She is afebrile. Some off an on nausea without emesis. She ate [employer] fried chicken for dinner the previous night with her mother and husband, they are both asymptomatic. LMP 11/25/12. Previous LMP before that was 3 months before. No urinary symptoms. No BRBPR. No melena. History of gallbladder sludge years ago.      Past Medical History  Diagnosis Date  . Glaucoma   . Migraine      Home Meds: Prior to Admission medications   Medication Sig Start Date End Date Taking? Authorizing Provider  Naproxen Sodium (ALEVE PO) Take by mouth.   Yes Historical Provider, MD  Vitamin D, Ergocalciferol, (DRISDOL) 50000 UNITS CAPS capsule Take 1 capsule  (50,000 Units total) by mouth every 7 (seven) days. 01/02/13  Yes Morrell Riddle, PA-C  promethazine (PHENERGAN) 12.5 MG tablet Take 1 tablet (12.5 mg total) by mouth every 8 (eight) hours as needed for nausea. 08/15/11 08/22/11  Nelva Nay, PA-C    Allergies: No Known Allergies  History   Social History  . Marital Status: Married    Spouse Name: Katye Valek    Number of Children: 0  . Years of Education: N/A   Occupational History  . CMA    Social History Main Topics  . Smoking status: Never Smoker   . Smokeless tobacco: Not on file  . Alcohol Use: 0.6 oz/week    1 Glasses of wine per week  . Drug Use: No  . Sexual Activity: Yes    Partners: Male   Other Topics Concern  . Not on file   Social History Narrative   Lives with her husband and her mother.  They have no children.  Her husband travels frequently as a Optician, dispensing.     Review of Systems: Constitutional: negative for chills, fever, or fatigue  HEENT: negative for vision changes or hearing loss Cardiovascular: negative for chest pain or palpitations Respiratory: negative for hemoptysis, wheezing, shortness of breath, or cough Abdominal: see above Dermatological: negative for rash Neurologic: negative for headache, dizziness, or syncope   Physical Exam: Blood pressure 146/90, pulse 79, temperature 98.1 F (36.7 C), temperature source Oral, resp.  rate 16, height 5' 2.5" (1.588 m), weight 174 lb 6.4 oz (79.107 kg), last menstrual period 11/25/2012, SpO2 98.00%., Body mass index is 31.37 kg/(m^2). General: Well developed, well nourished, in no acute distress. Not toxic appearing.  Head: Normocephalic, atraumatic, eyes without discharge, sclera non-icteric, nares are without discharge. Bilateral auditory canals clear, TM's are without perforation, pearly grey and translucent with reflective cone of light bilaterally. Oral cavity moist, posterior pharynx without exudate, erythema, peritonsillar abscess, or post nasal  drip. Uvula midline.   Neck: Supple. No thyromegaly. Full ROM. No lymphadenopathy. Lungs: Clear bilaterally to auscultation without wheezes, rales, or rhonchi. Breathing is unlabored. Heart: RRR with S1 S2. No murmurs, rubs, or gallops appreciated. Abdomen: Soft, non-distended with normoactive bowel sounds. Diffuse TTP, with greatest TTP along RLQ. Positive Iliopsoas and table jar. Negative McBurney's. No hepatosplenomegaly. No guarding. No obvious abdominal masses. Msk:  Strength and tone normal for age. Extremities/Skin: Warm and dry. No clubbing or cyanosis. No edema. No rashes or suspicious lesions. Neuro: Alert and oriented X 3. Moves all extremities spontaneously. Gait is normal. CNII-XII grossly in tact. Psych:  Responds to questions appropriately with a normal affect.   Labs: Results for orders placed in visit on 01/10/13  POCT CBC      Result Value Range   WBC 7.0  4.6 - 10.2 K/uL   Lymph, poc 2.1  0.6 - 3.4   POC LYMPH PERCENT 29.5  10 - 50 %L   MID (cbc) 0.4  0 - 0.9   POC MID % 5.8  0 - 12 %M   POC Granulocyte 4.5  2 - 6.9   Granulocyte percent 64.7  37 - 80 %G   RBC 4.32  4.04 - 5.48 M/uL   Hemoglobin 13.2  12.2 - 16.2 g/dL   HCT, POC 16.1  09.6 - 47.9 %   MCV 96.5  80 - 97 fL   MCH, POC 30.6  27 - 31.2 pg   MCHC 31.7 (*) 31.8 - 35.4 g/dL   RDW, POC 04.5     Platelet Count, POC 285  142 - 424 K/uL   MPV 9.1  0 - 99.8 fL  POCT UA - MICROSCOPIC ONLY      Result Value Range   WBC, Ur, HPF, POC 0-1     RBC, urine, microscopic 1-3     Bacteria, U Microscopic 1+     Mucus, UA positive     Epithelial cells, urine per micros 0-2     Crystals, Ur, HPF, POC negative     Casts, Ur, LPF, POC negative     Yeast, UA negative    POCT URINALYSIS DIPSTICK      Result Value Range   Color, UA yellow     Clarity, UA clear     Glucose, UA negative     Bilirubin, UA negative     Ketones, UA negative     Spec Grav, UA 1.025     Blood, UA negative     pH, UA 6.5     Protein, UA  negative     Urobilinogen, UA 0.2     Nitrite, UA negative     Leukocytes, UA Negative    POCT URINE PREGNANCY      Result Value Range   Preg Test, Ur Negative       ASSESSMENT AND PLAN:  52 y.o. female with diffuse abdominal pain and nausea -CT abdomen and pelvis with call report -Further evaluation and treatment pending  results -Patient discussed with and seen by Dr. Cleta Alberts  Signed, Eula Listen, PA-C [facility] [place], [place] [address] [phone] 01/10/2013 8:45 AM

## 2013-01-11 ENCOUNTER — Telehealth: Payer: Self-pay | Admitting: *Deleted

## 2013-01-11 NOTE — Telephone Encounter (Signed)
Pt states that she do have some stomach pain but it is not as bad as it was on yesterday.  She took Bentyl last night and did ok.  She does not feels that she needs to come in to be rechecked today but will go to ER or come in if she experiences any pain that she cannot tolerate.

## 2013-01-12 NOTE — Telephone Encounter (Signed)
Call again today and check on status. Just make sure she is continuing to improve and not having fever or worsening pain. If she is then she needs to return to clinic

## 2013-01-13 ENCOUNTER — Encounter: Payer: Self-pay | Admitting: Internal Medicine

## 2013-01-13 ENCOUNTER — Other Ambulatory Visit (INDEPENDENT_AMBULATORY_CARE_PROVIDER_SITE_OTHER): Payer: 59 | Admitting: *Deleted

## 2013-01-13 DIAGNOSIS — N951 Menopausal and female climacteric states: Secondary | ICD-10-CM

## 2013-01-13 NOTE — Telephone Encounter (Signed)
Spoke to patient, she is working today. She will let us know if she needs anything.

## 2013-01-13 NOTE — Progress Notes (Signed)
Patient here for labs only. 

## 2013-01-17 LAB — ANTI MULLERIAN HORMONE: AMH AssessR: <0.03 ng/mL

## 2013-02-10 ENCOUNTER — Ambulatory Visit
Admission: RE | Admit: 2013-02-10 | Discharge: 2013-02-10 | Disposition: A | Discharge status: Home / Self Care | Payer: 59 | Source: Ambulatory Visit | Attending: Physician Assistant | Admitting: Physician Assistant

## 2013-02-10 DIAGNOSIS — Z1239 Encounter for other screening for malignant neoplasm of breast: Secondary | ICD-10-CM

## 2013-02-12 ENCOUNTER — Ambulatory Visit (AMBULATORY_SURGERY_CENTER): Payer: Self-pay

## 2013-02-12 VITALS — Ht 61.5 in | Wt 175.0 lb

## 2013-02-12 DIAGNOSIS — Z1211 Encounter for screening for malignant neoplasm of colon: Secondary | ICD-10-CM

## 2013-02-12 MED ORDER — SUPREP BOWEL PREP KIT 17.5-3.13-1.6 GM/177ML PO SOLN: 1.0000 | Freq: Once | ORAL | Status: DC

## 2013-02-23 ENCOUNTER — Ambulatory Visit (INDEPENDENT_AMBULATORY_CARE_PROVIDER_SITE_OTHER): Payer: 59 | Admitting: Physician Assistant

## 2013-02-23 VITALS — BP 146/86 | HR 102 | Temp 98.7°F | Resp 17 | Ht 63.0 in | Wt 174.0 lb

## 2013-02-23 DIAGNOSIS — R197 Diarrhea, unspecified: Secondary | ICD-10-CM

## 2013-02-23 DIAGNOSIS — E860 Dehydration: Secondary | ICD-10-CM

## 2013-02-23 DIAGNOSIS — K5289 Other specified noninfective gastroenteritis and colitis: Secondary | ICD-10-CM

## 2013-02-23 DIAGNOSIS — R112 Nausea with vomiting, unspecified: Secondary | ICD-10-CM

## 2013-02-23 DIAGNOSIS — K529 Noninfective gastroenteritis and colitis, unspecified: Secondary | ICD-10-CM

## 2013-02-23 LAB — POCT CBC
Granulocyte percent: 91.6 %G — AB (ref 37–80)
HCT, POC: 45.3 % (ref 37.7–47.9)
Hemoglobin: 14.6 g/dL (ref 12.2–16.2)
Lymph, poc: 0.5 — AB (ref 0.6–3.4)
MCH, POC: 30.9 pg (ref 27–31.2)
MCHC: 32.2 g/dL (ref 31.8–35.4)
MCV: 95.9 fL (ref 80–97)
MID (cbc): 0.3 (ref 0–0.9)
MPV: 9.2 fL (ref 0–99.8)
POC Granulocyte: 8.4 — AB (ref 2–6.9)
POC LYMPH PERCENT: 5.6 %L — AB (ref 10–50)
POC MID %: 2.8 %M (ref 0–12)
Platelet Count, POC: 254 10*3/uL (ref 142–424)
RBC: 4.72 M/uL (ref 4.04–5.48)
RDW, POC: 12.8 %
WBC: 9.2 10*3/uL (ref 4.6–10.2)

## 2013-02-23 LAB — POCT INFLUENZA A/B
Influenza A, POC: NEGATIVE
Influenza B, POC: NEGATIVE

## 2013-02-23 NOTE — Progress Notes (Signed)
   Subjective:    Patient ID: Marie Porter, female    DOB: [birth year], 53 y.o.   MRN: [id number]  HPI 53 year old female presents for evaluation of acute onset of nausea, vomiting, and diarrhea. Symptoms started suddenly at 2:00 a.m. Has had about 6 episodes of emesis and 12-15 episodes of diarrhea.  Complains of nausea and decreased appetite.  Denies abdominal pain, fever, chills, cough, URI sx's, headache, dysuria, frequency, bloody stool or hematemesis.  No hx of abdominal surgeries.   Patient is otherwise doing well with no other concerns today.     Review of Systems  Constitutional: Negative for fever and chills.  HENT: Negative for congestion, sinus pressure and sore throat.   Respiratory: Negative for cough.   Cardiovascular: Negative for chest pain.  Gastrointestinal: Positive for nausea, vomiting and diarrhea. Negative for abdominal pain and blood in stool.  Genitourinary: Negative for dysuria and frequency.  Neurological: Negative for dizziness and headaches.       Objective:   Physical Exam  Constitutional: She is oriented to person, place, and time. She appears well-developed and well-nourished.  HENT:  Head: Normocephalic and atraumatic.  Right Ear: External ear normal.  Left Ear: External ear normal.  Eyes: Conjunctivae are normal.  Neck: Normal range of motion.  Cardiovascular: Normal rate, regular rhythm and normal heart sounds.   Pulmonary/Chest: Effort normal and breath sounds normal.  Abdominal: Soft. Bowel sounds are normal. There is no tenderness. There is no rebound and no guarding.  Neurological: She is alert and oriented to person, place, and time.  Psychiatric: She has a normal mood and affect. Her behavior is normal. Judgment and thought content normal.    Results for orders placed in visit on 02/23/13  POCT INFLUENZA A/B      Result Value Range   Influenza A, POC Negative     Influenza B, POC Negative    POCT CBC      Result Value Range   WBC  9.2  4.6 - 10.2 K/uL   Lymph, poc 0.5 (*) 0.6 - 3.4   POC LYMPH PERCENT 5.6 (*) 10 - 50 %L   MID (cbc) 0.3  0 - 0.9   POC MID % 2.8  0 - 12 %M   POC Granulocyte 8.4 (*) 2 - 6.9   Granulocyte percent 91.6 (*) 37 - 80 %G   RBC 4.72  4.04 - 5.48 M/uL   Hemoglobin 14.6  12.2 - 16.2 g/dL   HCT, POC 12.8  78.6 - 47.9 %   MCV 95.9  80 - 97 fL   MCH, POC 30.9  27 - 31.2 pg   MCHC 32.2  31.8 - 35.4 g/dL   RDW, POC 76.7     Platelet Count, POC 254  142 - 424 K/uL   MPV 9.2  0 - 99.8 fL    Patient improved significantly after 1 liter of fluid today. No longer feels nauseous and dizziness improved.      Assessment & Plan:   Gastroenteritis  Nausea with vomiting - Plan: POCT Influenza A/B, POCT CBC  Diarrhea Labs reassuring Patient has Zofran at home that she will take every 8 hours as needed for nausea Push fluids. BRAT diet Follow up if symptoms worsen or fail to improve.

## 2013-02-24 ENCOUNTER — Other Ambulatory Visit: Payer: Self-pay | Admitting: Physician Assistant

## 2013-02-24 DIAGNOSIS — R928 Other abnormal and inconclusive findings on diagnostic imaging of breast: Secondary | ICD-10-CM

## 2013-02-27 ENCOUNTER — Other Ambulatory Visit: Payer: Self-pay | Admitting: Physician Assistant

## 2013-02-27 ENCOUNTER — Other Ambulatory Visit: Payer: Self-pay

## 2013-02-27 DIAGNOSIS — R928 Other abnormal and inconclusive findings on diagnostic imaging of breast: Secondary | ICD-10-CM

## 2013-02-28 ENCOUNTER — Other Ambulatory Visit: Payer: Self-pay | Admitting: Physician Assistant

## 2013-02-28 DIAGNOSIS — R928 Other abnormal and inconclusive findings on diagnostic imaging of breast: Secondary | ICD-10-CM

## 2013-03-03 ENCOUNTER — Ambulatory Visit
Admission: RE | Admit: 2013-03-03 | Discharge: 2013-03-03 | Disposition: A | Discharge status: Home / Self Care | Payer: 59 | Source: Ambulatory Visit | Attending: Physician Assistant | Admitting: Physician Assistant

## 2013-03-03 ENCOUNTER — Ambulatory Visit (AMBULATORY_SURGERY_CENTER): Payer: 59 | Admitting: Internal Medicine

## 2013-03-03 ENCOUNTER — Encounter: Payer: Self-pay | Admitting: Internal Medicine

## 2013-03-03 VITALS — BP 160/94 | HR 69 | Temp 98.0°F | Resp 15 | Ht 61.5 in | Wt 175.0 lb

## 2013-03-03 DIAGNOSIS — R928 Other abnormal and inconclusive findings on diagnostic imaging of breast: Secondary | ICD-10-CM

## 2013-03-03 DIAGNOSIS — Z1211 Encounter for screening for malignant neoplasm of colon: Secondary | ICD-10-CM

## 2013-03-03 MED ORDER — SODIUM CHLORIDE 0.9 % IV SOLN: 500.0000 mL | INTRAVENOUS | Status: DC

## 2013-03-03 NOTE — Progress Notes (Signed)
Procedure ends, to recovery, report given and VSS. 

## 2013-03-03 NOTE — Op Note (Signed)
[facility] [address] [place], [address]   COLONOSCOPY PROCEDURE REPORT  PATIENT: Marie Porter, Marie Porter  MR#: [id number] BIRTHDATE: September 29, 1960 , 52  yrs. old GENDER: Female ENDOSCOPIST: Iva Boop, MD, [facility] REFERRED HG:[id number] Tinnie Gens, [place] PROCEDURE DATE:  03/03/2013 PROCEDURE:   Colonoscopy, screening First Screening Colonoscopy - Avg.  risk and is 50 yrs.  old or older Yes.  Prior Negative Screening - Now for repeat screening. N/A  History of Adenoma - Now for follow-up colonoscopy & has been > or = to 3 yrs.  N/A  Polyps Removed Today? No.  Recommend repeat exam, <10 yrs? No. ASA CLASS:   Class II INDICATIONS:average risk screening and first colonoscopy. MEDICATIONS: propofol (Diprivan) 200mg  IV, MAC sedation, administered by CRNA, and These medications were titrated to patient response per physician's verbal order  DESCRIPTION OF PROCEDURE:   After the risks benefits and alternatives of the procedure were thoroughly explained, informed consent was obtained.  A digital rectal exam revealed no abnormalities of the rectum.   The LB [id number]-[id number]  endoscope was introduced through the anus and advanced to the cecum, which was identified by both the appendix and ileocecal valve. No adverse events experienced.   The quality of the prep was excellent using Suprep  The instrument was then slowly withdrawn as the colon was fully examined.      COLON FINDINGS: A normal appearing cecum, ileocecal valve, and appendiceal orifice were identified.  The ascending, hepatic flexure, transverse, splenic flexure, descending, sigmoid colon and rectum appeared unremarkable.  No polyps or cancers were seen.   A right colon retroflexion was performed.  Retroflexed views revealed no abnormalities. The time to cecum=1 minutes 57 seconds. Withdrawal time=8 minutes 53 seconds.  The scope was withdrawn and the procedure completed. COMPLICATIONS: There were no  complications.  ENDOSCOPIC IMPRESSION: Normal colonoscopy - excellent prep  RECOMMENDATIONS: Repeat colonoscopy 10 years - 2025   eSigned:  Iva Boop, MD, [facility] 03/03/2013 10:13 AM   cc: Theora Gianotti, PA and The Patient

## 2013-03-03 NOTE — Patient Instructions (Addendum)
The colonoscopy was normal!  Prep was excellent.  Next routine colonoscopy in 10 years - 2025  It is the time of year to have a vaccination to prevent the flu (influenza virus). Please have this done (if not already done) through your primary care provider or you can get this done at local pharmacies or the [facility]. It would be very helpful if you notify your primary care provider when and where you had the vaccination given by messaging them in My Chart, leaving a message or faxing the information.  I appreciate the opportunity to care for you. Iva Booparl E. Dana Dorner, MD, FACG   YOU HAD AN ENDOSCOPIC PROCEDURE TODAY AT THE [facility]: Refer to the procedure report that was given to you for any specific questions about what was found during the examination.  If the procedure report does not answer your questions, please call your gastroenterologist to clarify.  If you requested that your care partner not be given the details of your procedure findings, then the procedure report has been included in a sealed envelope for you to review at your convenience later.  YOU SHOULD EXPECT: Some feelings of bloating in the abdomen. Passage of more gas than usual.  Walking can help get rid of the air that was put into your GI tract during the procedure and reduce the bloating. If you had a lower endoscopy (such as a colonoscopy or flexible sigmoidoscopy) you may notice spotting of blood in your stool or on the toilet paper. If you underwent a bowel prep for your procedure, then you may not have a normal bowel movement for a few days.  DIET: Your first meal following the procedure should be a light meal and then it is ok to progress to your normal diet.  A half-sandwich or bowl of soup is an example of a good first meal.  Heavy or fried foods are harder to digest and may make you feel nauseous or bloated.  Likewise meals heavy in dairy and vegetables can cause extra gas to form and this can also  increase the bloating.  Drink plenty of fluids but you should avoid alcoholic beverages for 24 hours.  ACTIVITY: Your care partner should take you home directly after the procedure.  You should plan to take it easy, moving slowly for the rest of the day.  You can resume normal activity the day after the procedure however you should NOT DRIVE or use heavy machinery for 24 hours (because of the sedation medicines used during the test).    SYMPTOMS TO REPORT IMMEDIATELY: A gastroenterologist can be reached at any hour.  During normal business hours, 8:30 AM to 5:00 PM Monday through Friday, call [phone].  After hours and on weekends, please call the GI answering service at [phone] who will take a message and have the physician on call contact you.   Following lower endoscopy (colonoscopy or flexible sigmoidoscopy):  Excessive amounts of blood in the stool  Significant tenderness or worsening of abdominal pains  Swelling of the abdomen that is new, acute  Fever of 100F or higher   FOLLOW UP: If any biopsies were taken you will be contacted by phone or by letter within the next 1-3 weeks.  Call your gastroenterologist if you have not heard about the biopsies in 3 weeks.  Our staff will call the home number listed on your records the next business day following your procedure to check on you and address any questions or  concerns that you may have at that time regarding the information given to you following your procedure. This is a courtesy call and so if there is no answer at the home number and we have not heard from you through the emergency physician on call, we will assume that you have returned to your regular daily activities without incident.  SIGNATURES/CONFIDENTIALITY: You and/or your care partner have signed paperwork which will be entered into your electronic medical record.  These signatures attest to the fact that that the information above on your After Visit Summary has  been reviewed and is understood.  Full responsibility of the confidentiality of this discharge information lies with you and/or your care-partner.  Normal exam.  Repeat colonoscopy in 10 years-2025.

## 2013-03-04 ENCOUNTER — Telehealth: Payer: Self-pay | Admitting: *Deleted

## 2013-03-04 NOTE — Telephone Encounter (Signed)
  Follow up Call-  Call back number 03/03/2013  Post procedure Call Back phone  # [phone]     Patient questions:  Do you have a fever, pain , or abdominal swelling? no Pain Score  0 *  Have you tolerated food without any problems? yes  Have you been able to return to your normal activities? yes  Do you have any questions about your discharge instructions: Diet   no Medications  no Follow up visit  no  Do you have questions or concerns about your Care? no  Actions: * If pain score is 4 or above: No action needed, pain <4.

## 2013-03-17 ENCOUNTER — Other Ambulatory Visit: Payer: Self-pay | Admitting: Physician Assistant

## 2013-03-17 DIAGNOSIS — E559 Vitamin D deficiency, unspecified: Secondary | ICD-10-CM

## 2013-03-18 ENCOUNTER — Ambulatory Visit (INDEPENDENT_AMBULATORY_CARE_PROVIDER_SITE_OTHER): Payer: 59

## 2013-03-18 DIAGNOSIS — E559 Vitamin D deficiency, unspecified: Secondary | ICD-10-CM

## 2013-03-19 LAB — VITAMIN D 25 HYDROXY (VIT D DEFICIENCY, FRACTURES): VIT D 25 HYDROXY: 25 ng/mL — AB (ref 30–89)

## 2013-03-25 ENCOUNTER — Other Ambulatory Visit: Payer: Self-pay | Admitting: Physician Assistant

## 2013-03-25 MED ORDER — VITAMIN D 1000 UNITS PO TABS
2000.0000 [IU] | ORAL_TABLET | Freq: Every day | ORAL | Status: DC
Start: 2013-03-25 — End: 2018-12-15

## 2017-05-28 ENCOUNTER — Encounter: Payer: Self-pay | Admitting: Physician Assistant

## 2017-08-05 ENCOUNTER — Encounter (HOSPITAL_COMMUNITY): Payer: Self-pay | Admitting: Emergency Medicine

## 2017-08-05 ENCOUNTER — Ambulatory Visit (HOSPITAL_COMMUNITY)
Admission: EM | Admit: 2017-08-05 | Discharge: 2017-08-05 | Disposition: A | Discharge status: Home / Self Care | Payer: Self-pay | Attending: Family Medicine | Admitting: Family Medicine

## 2017-08-05 DIAGNOSIS — G43009 Migraine without aura, not intractable, without status migrainosus: Secondary | ICD-10-CM

## 2017-08-05 DIAGNOSIS — R110 Nausea: Secondary | ICD-10-CM | POA: Diagnosis not present

## 2017-08-05 MED ORDER — KETOROLAC TROMETHAMINE 60 MG/2ML IM SOLN
INTRAMUSCULAR | Status: AC
  Filled 2017-08-05: qty 2

## 2017-08-05 MED ORDER — METOCLOPRAMIDE HCL 5 MG/ML IJ SOLN
5.0000 mg | Freq: Once | INTRAMUSCULAR | Status: AC
  Administered 2017-08-05: 5 mg via INTRAMUSCULAR

## 2017-08-05 MED ORDER — METOCLOPRAMIDE HCL 5 MG/ML IJ SOLN
INTRAMUSCULAR | Status: AC
  Filled 2017-08-05: qty 2

## 2017-08-05 MED ORDER — KETOROLAC TROMETHAMINE 60 MG/2ML IM SOLN
60.0000 mg | Freq: Once | INTRAMUSCULAR | Status: AC
  Administered 2017-08-05: 60 mg via INTRAMUSCULAR

## 2017-08-05 MED ORDER — ONDANSETRON 4 MG PO TBDP: 4.0000 mg | ORAL_TABLET | Freq: Three times a day (TID) | ORAL | 0 refills | Status: DC | PRN

## 2017-08-05 NOTE — ED Triage Notes (Signed)
Pt here for HA with hx of same

## 2017-08-05 NOTE — ED Provider Notes (Signed)
[facility]    CSN: [id number] Arrival date & time: 08/05/17  1158     History   Chief Complaint Chief Complaint  Patient presents with  . Headache    HPI Marie Porter is a 57 y.o. female history of migraines presenting today for evaluation of a headache.  Patient states that this feels like her previous migraines.  Started this morning when she woke up around 3, worsened around 6:00.  Subsequently has developed vomiting around 10:00.  She tried to take Advil and drink coffee but this did not help and ended up vomiting this up.  Has had nausea, blurry vision.  Photophobia and phonophobia.  Denies difficulty speaking, loss of vision, one-sided weakness.  HPI  Past Medical History:  Diagnosis Date  . Glaucoma   . Migraine     Patient Active Problem List   Diagnosis Date Noted  . Migraine 12/31/2012    Past Surgical History:  Procedure Laterality Date  . EYE SURGERY     glaucoma-multiple in childhood  . GANGLION CYST EXCISION Left 10/12/2011   FOOT    OB History   None      Home Medications    Prior to Admission medications   Medication Sig Start Date End Date Taking? Authorizing Provider  cholecalciferol (VITAMIN D) 1000 UNITS tablet Take 2 tablets (2,000 Units total) by mouth daily. 03/25/13   Weber, Dema Severin, PA-C  Naproxen Sodium (ALEVE PO) Take by mouth.    [provider]  ondansetron (ZOFRAN ODT) 4 MG disintegrating tablet Take 1 tablet (4 mg total) by mouth every 8 (eight) hours as needed for nausea or vomiting. 08/05/17   Khizar Fiorella C, PA-C  Vitamin D, Ergocalciferol, (DRISDOL) 50000 UNITS CAPS capsule Take 1 capsule (50,000 Units total) by mouth every 7 (seven) days. 01/02/13   Morrell Riddle, PA-C    Family History Family History  Problem Relation Age of Onset  . Hypertension Mother   . Stroke Mother   . Seizures Mother   . Cancer Father        brain  . Seizures Father 20       secondary to brain cancer  . Stroke  Maternal Grandmother   . Hypertension Maternal Grandmother   . Diabetes Maternal Grandfather   . Heart disease Paternal Grandmother   . Pneumonia Paternal Grandfather   . Colon cancer Neg Hx   . Pancreatic cancer Neg Hx   . Stomach cancer Neg Hx   . Ulcers Mother        duodenal and jejunal    Social History Social History   Tobacco Use  . Smoking status: Never Smoker  . Smokeless tobacco: Never Used  Substance Use Topics  . Alcohol use: Yes    Alcohol/week: 0.6 oz    Types: 1 Glasses of wine per week  . Drug use: No     Allergies   Patient has no known allergies.   Review of Systems Review of Systems  Constitutional: Negative for activity change, appetite change, chills, fatigue and fever.  HENT: Negative for congestion, ear pain, rhinorrhea, sinus pressure, sore throat and trouble swallowing.   Eyes: Positive for photophobia and visual disturbance. Negative for pain.  Respiratory: Negative for cough, chest tightness and shortness of breath.   Cardiovascular: Negative for chest pain.  Gastrointestinal: Positive for nausea and vomiting. Negative for abdominal pain.  Musculoskeletal: Negative for myalgias.  Skin: Negative for rash.  Neurological: Positive for headaches. Negative for dizziness,  weakness and light-headedness.     Physical Exam Triage Vital Signs ED Triage Vitals [08/05/17 1215]  Enc Vitals Group     BP (!) 152/95     Pulse Rate 82     Resp 18     Temp 98.2 F (36.8 C)     Temp Source Oral     SpO2 97 %     Weight      Height      Head Circumference      Peak Flow      Pain Score      Pain Loc      Pain Edu?      Excl. in GC?    No data found.  Updated Vital Signs BP (!) 152/95 (BP Location: Left Arm)   Pulse 82   Temp 98.2 F (36.8 C) (Oral)   Resp 18   SpO2 97%   Visual Acuity Right Eye Distance:   Left Eye Distance:   Bilateral Distance:    Right Eye Near:   Left Eye Near:    Bilateral Near:     Physical Exam    Constitutional: She is oriented to person, place, and time. She appears well-developed and well-nourished. No distress.  Sitting comfortably on exam table  HENT:  Head: Normocephalic and atraumatic.  Mouth/Throat: Oropharynx is clear and moist.  Eyes: Conjunctivae are normal.  Neck: Neck supple.  Cardiovascular: Normal rate and regular rhythm.  No murmur heard. Pulmonary/Chest: Effort normal and breath sounds normal. No respiratory distress.  Abdominal: Soft. There is no tenderness.  Musculoskeletal: She exhibits no edema.  Able to ambulate from chair to exam table without assistance or abnormality  Neurological: She is alert and oriented to person, place, and time.  Patient A&O x3, cranial nerves II-XII grossly intact, strength at shoulders, hips and knees 5/5, equal bilaterally, patellar reflex 2+ bilaterally. Normal Finger to nose, RAM and heel to shin. Negative Romberg and Pronator Drift. Gait without abnormality.   Skin: Skin is warm and dry.  Psychiatric: She has a normal mood and affect.  Nursing note and vitals reviewed.    UC Treatments / Results  Labs (all labs ordered are listed, but only abnormal results are displayed) Labs Reviewed - No data to display  EKG None  Radiology No results found.  Procedures Procedures (including critical care time)  Medications Ordered in UC Medications  ketorolac (TORADOL) injection 60 mg (60 mg Intramuscular Given 08/05/17 1242)  metoCLOPramide (REGLAN) injection 5 mg (5 mg Intramuscular Given 08/05/17 1242)    Initial Impression / Assessment and Plan / UC Course  I have reviewed the triage vital signs and the nursing notes.  Pertinent labs & imaging results that were available during my care of the patient were reviewed by me and considered in my medical decision making (see chart for details).     Patient with migraine, no neuro deficit.  Will provide Toradol and Reglan in clinic today.  We will send home with Zofran and  continue to use anti-inflammatories.Discussed strict return precautions. Patient verbalized understanding and is agreeable with plan.  Final Clinical Impressions(s) / UC Diagnoses   Final diagnoses:  Migraine without aura and without status migrainosus, not intractable     Discharge Instructions     We gave you a shot of Toradol and Reglan today for headache.  He should plan working about 30-40 minutes.  Please continue to take Tylenol or ibuprofen or Aleve at home for further headache management.  May  use Zofran underneath tongue for continued nausea or vomiting.  Please return here or go to emergency room if symptoms not improving, worsening, developing one-sided weakness, difficulty speaking, changes in vision, worsening headache.    ED Prescriptions    Medication Sig Dispense Auth. Provider   ondansetron (ZOFRAN ODT) 4 MG disintegrating tablet Take 1 tablet (4 mg total) by mouth every 8 (eight) hours as needed for nausea or vomiting. 20 tablet Merril Nagy, [place] C, PA-C     Controlled Substance Prescriptions [place] Controlled Substance Registry consulted? Not Applicable   Lew Dawes, [place] 08/05/17 1243

## 2017-08-05 NOTE — Discharge Instructions (Signed)
We gave you a shot of Toradol and Reglan today for headache.  He should plan working about 30-40 minutes.  Please continue to take Tylenol or ibuprofen or Aleve at home for further headache management.  May use Zofran underneath tongue for continued nausea or vomiting.  Please return here or go to emergency room if symptoms not improving, worsening, developing one-sided weakness, difficulty speaking, changes in vision, worsening headache.

## 2018-12-12 ENCOUNTER — Other Ambulatory Visit: Payer: Self-pay | Admitting: Nurse Practitioner

## 2018-12-12 DIAGNOSIS — Z1231 Encounter for screening mammogram for malignant neoplasm of breast: Secondary | ICD-10-CM

## 2018-12-15 ENCOUNTER — Emergency Department (HOSPITAL_COMMUNITY)
Admission: EM | Admit: 2018-12-15 | Discharge: 2018-12-15 | Disposition: A | Discharge status: Home / Self Care | Payer: PRIVATE HEALTH INSURANCE | Attending: Emergency Medicine | Admitting: Emergency Medicine

## 2018-12-15 ENCOUNTER — Emergency Department (HOSPITAL_COMMUNITY): Payer: PRIVATE HEALTH INSURANCE

## 2018-12-15 ENCOUNTER — Encounter (HOSPITAL_COMMUNITY): Payer: Self-pay | Admitting: *Deleted

## 2018-12-15 ENCOUNTER — Other Ambulatory Visit: Payer: Self-pay

## 2018-12-15 DIAGNOSIS — J45909 Unspecified asthma, uncomplicated: Secondary | ICD-10-CM | POA: Insufficient documentation

## 2018-12-15 DIAGNOSIS — R079 Chest pain, unspecified: Secondary | ICD-10-CM

## 2018-12-15 DIAGNOSIS — R072 Precordial pain: Secondary | ICD-10-CM | POA: Insufficient documentation

## 2018-12-15 HISTORY — DX: Unspecified asthma, uncomplicated: J45.909

## 2018-12-15 LAB — CBC
HCT: 41.2 % (ref 36.0–46.0)
Hemoglobin: 13.7 g/dL (ref 12.0–15.0)
MCH: 30.9 pg (ref 26.0–34.0)
MCHC: 33.3 g/dL (ref 30.0–36.0)
MCV: 93 fL (ref 80.0–100.0)
Platelets: 318 10*3/uL (ref 150–400)
RBC: 4.43 MIL/uL (ref 3.87–5.11)
RDW: 12.7 % (ref 11.5–15.5)
WBC: 7.1 10*3/uL (ref 4.0–10.5)
nRBC: 0 % (ref 0.0–0.2)

## 2018-12-15 LAB — BASIC METABOLIC PANEL
Anion gap: 10 (ref 5–15)
BUN: 18 mg/dL (ref 6–20)
CO2: 22 mmol/L (ref 22–32)
Calcium: 9.5 mg/dL (ref 8.9–10.3)
Chloride: 109 mmol/L (ref 98–111)
Creatinine, Ser: 1.03 mg/dL — ABNORMAL HIGH (ref 0.44–1.00)
GFR calc Af Amer: >60 mL/min (ref >60)
GFR calc non Af Amer: >60 mL/min (ref >60)
Glucose, Bld: 92 mg/dL (ref 70–99)
Potassium: 4 mmol/L (ref 3.5–5.1)
Sodium: 141 mmol/L (ref 135–145)

## 2018-12-15 LAB — TROPONIN I (HIGH SENSITIVITY): Troponin I (High Sensitivity): 8 ng/L (ref <18)

## 2018-12-15 LAB — I-STAT BETA HCG BLOOD, ED (MC, WL, AP ONLY): I-stat hCG, quantitative: 16.3 m[IU]/mL — ABNORMAL HIGH (ref <5)

## 2018-12-15 MED ORDER — SODIUM CHLORIDE 0.9% FLUSH: 3.0000 mL | Freq: Once | INTRAVENOUS | Status: DC

## 2018-12-15 NOTE — Discharge Instructions (Signed)
Keep your cardiology appointment on Thursday.  Please continue to follow-up with your primary care doctor and have them reassess her blood pressure as it was elevated today.  Return immediately to the ED if you have any new or concerning symptoms.  If you have any worsening chest pain, shortness of breath, nausea vomiting with your chest pain.  As we discussed, due to your first cardiac enzyme level results it would be prudent to keep you for an additional retest you have declined.  Please know that you were welcome to return anytime to the ED for reevaluation.

## 2018-12-15 NOTE — ED Notes (Signed)
Patient verbalizes understanding of discharge instructions. Opportunity for questioning and answers were provided. Armband removed by staff, pt discharged from ED.  

## 2018-12-15 NOTE — ED Triage Notes (Signed)
Pt states central chest pain that radiates to both arms and there arms tingle.  States she started having chest pain about 3 weeks ago, but this pain started 2 hours ato (1330).

## 2018-12-15 NOTE — ED Provider Notes (Addendum)
[facility] EMERGENCY DEPARTMENT Provider Note   CSN: [id number] Arrival date & time: 12/15/18  1537     History   Chief Complaint Chief Complaint  Patient presents with  . Chest Pain    HPI Marie Porter is a 58 y.o. female history of asthma and hypertension  HPI: A 58 year old patient with a history of hypertension presents for evaluation of chest pain. Initial onset of pain was approximately 3-6 hours ago. The patient's chest pain is well-localized and is not worse with exertion. The patient's chest pain is not middle- or left-sided, is not described as heaviness/pressure/tightness, is not sharp and does radiate to the arms/jaw/neck. The patient does not complain of nausea and denies diaphoresis. The patient has no history of stroke, has no history of peripheral artery disease, has not smoked in the past 90 days, denies any history of treated diabetes, has no relevant family history of coronary artery disease (first degree relative at less than age 58), has no history of hypercholesterolemia and does not have an elevated BMI (>=30).   HPI  Patient presents to ED for 2 hours of constant, 2/10 sternal chest pain that is achy in character and worse with laying supine better with sitting upright.  Patient states that she has intermittently had this pain over the past 2 weeks while exerting herself on a 2 mile walk which she has been doing daily this past month.  States that she feels short of breath and some tingling in her left arm when this occurs.  States that when she stops it she immediately improves.  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  Patient scheduled a cardiology appointment for this Thursday.  However beginning 2 hours prior to arrival patient was sitting on couch when she had similar, achy, 2/10  sternal chest pain.  Patient denies any sensation of chest pressure, headache, dizziness or current shortness of breath.  Denies any back or neck pain or history of neuropathy.  Patient is not on any hormonal contraceptives, denies leg swelling, history of DVT or VTE, recent surgery or immobilization.    Past Medical History:  Diagnosis Date  . Asthma   . Glaucoma   . Migraine     Patient Active Problem List   Diagnosis Date Noted  . Migraine 12/31/2012    Past Surgical History:  Procedure Laterality Date  . EYE SURGERY     glaucoma-multiple in childhood  . GANGLION CYST EXCISION Left 10/12/2011   FOOT     OB History   No obstetric history on file.      Home Medications    Prior to Admission medications   Not on File    Family History Family History  Problem Relation Age of Onset  . Hypertension Mother   . Stroke Mother   . Seizures Mother   . Ulcers Mother        duodenal and jejunal  . Cancer Father        brain  . Seizures Father 68       secondary to brain cancer  . Stroke Maternal Grandmother   . Hypertension Maternal Grandmother   . Diabetes Maternal Grandfather   . Heart disease Paternal Grandmother   . Pneumonia Paternal Grandfather   . Colon cancer Neg Hx   .  Pancreatic cancer Neg Hx   . Stomach cancer Neg Hx     Social History Social History   Tobacco Use  . Smoking status: Never Smoker  . Smokeless tobacco: Never Used  Substance Use Topics  . Alcohol use: Not Currently    Alcohol/week: 1.0 standard drinks    Types: 1 Glasses of wine per week  . Drug use: No     Allergies   Patient has no known allergies.   Review of Systems Review of Systems  Constitutional: Negative for fever.  HENT: Negative for congestion.   Eyes: Negative for pain.  Respiratory: Positive for chest tightness and shortness of breath. Negative for cough.   Cardiovascular: Positive for chest pain. Negative for leg swelling.  Gastrointestinal: Negative for  abdominal pain.  Genitourinary: Negative for dysuria.  Musculoskeletal: Negative for back pain.  Skin: Negative for rash.  Neurological: Negative for numbness.  Psychiatric/Behavioral: Negative for agitation.     Physical Exam Updated Vital Signs BP (!) 164/93 (BP Location: Right Arm)   Pulse 95   Temp 98 F (36.7 C) (Oral)   Resp 18   Ht 5' 1.5" (1.562 m)   Wt 86.2 kg   SpO2 100%   BMI 35.32 kg/m   Physical Exam Vitals signs and nursing note reviewed.  Constitutional:      General: She is not in acute distress.    Comments: Is in no acute distress.  Pleasant demeanor.  Nondiaphoretic.    HENT:     Head: Normocephalic and atraumatic.     Nose: Nose normal.     Mouth/Throat:     Mouth: Mucous membranes are moist.  Eyes:     General: No scleral icterus. Neck:     Musculoskeletal: Normal range of motion.  Cardiovascular:     Rate and Rhythm: Normal rate and regular rhythm.     Pulses: Normal pulses.     Heart sounds: Normal heart sounds.  Pulmonary:     Effort: Pulmonary effort is normal. No respiratory distress.     Breath sounds: Normal breath sounds. No wheezing.  Abdominal:     Palpations: Abdomen is soft.     Tenderness: There is no abdominal tenderness.  Musculoskeletal:     Right lower leg: No edema.     Left lower leg: No edema.     Comments: Mild tenderness to palpation over sternum, tenderness to palpation over clavicles or arm  No leg swelling or calf tenderness.  Skin:    General: Skin is warm and dry.     Capillary Refill: Capillary refill takes less than 2 seconds.  Neurological:     Mental Status: She is alert. Mental status is at baseline.     Motor: No weakness.  Psychiatric:        Mood and Affect: Mood normal.        Behavior: Behavior normal.      ED Treatments / Results  Labs (all labs ordered are listed, but only abnormal results are displayed) Labs Reviewed  BASIC METABOLIC PANEL - Abnormal; Notable for the following components:       Result Value   Creatinine, Ser 1.03 (*)    All other components within normal limits  I-STAT BETA HCG BLOOD, ED ([facility], [facility], [facility] ONLY) - Abnormal; Notable for the following components:   I-stat hCG, quantitative 16.3 (*)    All other components within normal limits  CBC  TROPONIN I (HIGH SENSITIVITY)  TROPONIN I (HIGH SENSITIVITY)  EKG EKG Interpretation  Date/Time:  Sunday December 15 2018 15:56:57 EDT Ventricular Rate:  90 PR Interval:  164 QRS Duration: 160 QT Interval:  432 QTC Calculation: 528 R Axis:   -174 Text Interpretation:  Normal sinus rhythm Right superior axis deviation Non-specific intra-ventricular conduction block Abnormal ECG Confirmed by Virgina NorfolkAdam, [place] [phone]([id number]) on 12/15/2018 4:31:58 PM   Radiology Dg Chest 2 View  Result Date: 12/15/2018 CLINICAL DATA:  Chest pain EXAM: CHEST - 2 VIEW COMPARISON:  None. FINDINGS: Lungs are clear. Heart is borderline enlarged with pulmonary vascularity normal. No adenopathy. No pneumothorax. No bone lesions. IMPRESSION: No edema or consolidation.  Mild cardiac enlargement. Electronically Signed   By: Bretta BangWilliam  Woodruff III M.D.   On: 12/15/2018 16:31    Procedures Procedures (including critical care time)  Medications Ordered in ED Medications  sodium chloride flush (NS) 0.9 % injection 3 mL (has no administration in time range)     Initial Impression / Assessment and Plan / ED Course  I have reviewed the triage vital signs and the nursing notes.  Pertinent labs & imaging results that were available during my care of the patient were reviewed by me and considered in my medical decision making (see chart for details).  Patient is 58 year old female with 2-week history of achy chest pain during her morning fast walk that resolves with rest.  Today presents with similar chest pain but at rest began at 2 PM.  Patient has no recent URI symptoms, no pericardial friction rub heard and pain is not improved with leaning  forward.  Doubt pericarditis/myopericarditis.  Patient is PERC negative.  Symptoms not consistent with pulmonary embolism chest pain is nonpleuritic.  Unlikely to be ACS as patient has no history of diabetes, obesity, HLD, smoking, close family history of early heart attack.  Patient does have history of hypertension.  Will trend troponins.     HEAR Score: 3  Patient has heart score of 3.  EKG shows right bundle branch block consistent with patient's recollection of her last EKG.  Troponin initially 8, second troponin indicated by [facility] heart pathway algorithm however patient declined she feels better and has cardiology appointment scheduled for Thursday at this has been a problem for the past 2 weeks.   5:48 PM Reassessed patient and shared discussion with her over repeat troponin. Patient with reproducible chest wall tenderness and well localized pain that is gradually improved from 3 hours ago.  Likely musculoskeletal etiology.  Patient also states she has history of asthma states she was recently prescribed steroid inhaler which she has not yet used.    Patient appears stable for discharge I strongly recommended she stay for repeat troponin however she would prefer to leave.  Patient is coherent and has decision-making capacity at this time.   Vital signs within normal limits other than elevated blood pressure works at her primary care office and states she will recheck this tomorrow morning and follow-up with her PCP for possible initiation medication for hypertension.  Final Clinical Impressions(s) / ED Diagnoses     Patient declines second troponin would like to leave this time.  Feel that she is stable medically vital signs are normal and pain is improved.  Chest pain description is not typical for cardiac pain.  Patient has good follow-up with cardiology as her appointment is Thursday.  Patient given strict return precautions.  Final diagnoses:  Nonspecific chest pain    ED  Discharge Orders    None  Gailen Shelter, [place] 12/15/18 1748    Virgina Norfolk, DO 12/15/18 1807    Gailen Shelter, [place] 12/26/18 1110    Virgina Norfolk, DO 12/28/18 [phone]

## 2018-12-26 ENCOUNTER — Other Ambulatory Visit: Payer: Self-pay | Admitting: Internal Medicine

## 2018-12-26 DIAGNOSIS — I447 Left bundle-branch block, unspecified: Secondary | ICD-10-CM

## 2018-12-26 DIAGNOSIS — R0602 Shortness of breath: Secondary | ICD-10-CM

## 2019-01-02 ENCOUNTER — Ambulatory Visit: Payer: Self-pay

## 2019-01-30 ENCOUNTER — Ambulatory Visit: Payer: PRIVATE HEALTH INSURANCE

## 2019-03-20 ENCOUNTER — Other Ambulatory Visit: Payer: Self-pay

## 2019-03-20 ENCOUNTER — Ambulatory Visit
Admission: RE | Admit: 2019-03-20 | Discharge: 2019-03-20 | Disposition: A | Discharge status: Home / Self Care | Payer: Self-pay | Source: Ambulatory Visit | Attending: Nurse Practitioner | Admitting: Nurse Practitioner

## 2019-03-20 DIAGNOSIS — Z1231 Encounter for screening mammogram for malignant neoplasm of breast: Secondary | ICD-10-CM

## 2020-05-06 ENCOUNTER — Other Ambulatory Visit: Payer: Self-pay | Admitting: Nurse Practitioner

## 2020-05-06 DIAGNOSIS — R7989 Other specified abnormal findings of blood chemistry: Secondary | ICD-10-CM

## 2020-05-06 DIAGNOSIS — R0602 Shortness of breath: Secondary | ICD-10-CM

## 2020-05-20 ENCOUNTER — Ambulatory Visit: Payer: PRIVATE HEALTH INSURANCE

## 2020-05-20 ENCOUNTER — Other Ambulatory Visit: Payer: Self-pay

## 2020-05-20 DIAGNOSIS — R0602 Shortness of breath: Secondary | ICD-10-CM

## 2020-05-20 DIAGNOSIS — R7989 Other specified abnormal findings of blood chemistry: Secondary | ICD-10-CM

## 2020-06-03 ENCOUNTER — Ambulatory Visit: Payer: Self-pay | Admitting: Cardiology

## 2020-06-03 NOTE — Progress Notes (Signed)
Date:  06/07/2020   ID:  Marie Porter, DOB 04-11-1960, MRN [id number]  PCP:  Ferd Hibbs, NP  Cardiologist: Rex Kras, DO, [facility] (established care 06/07/2020)  REASON FOR CONSULT: New onset atrial fibrillation  REQUESTING PHYSICIAN:  Ferd Hibbs, NP [place],   [id number]  Chief Complaint  Patient presents with  . Shortness of Breath    HPI  Marie Porter is a 60 y.o. female who presents to the office with a chief complaint of " shortness of breath." Patient's past medical history and cardiovascular risk factors include: Hx of COVID 19 infection (May 2021 and January 2022), chronic congestive heart failure, cardiomyopathy, history of glaucoma, hypertension, obesity due to excess calories.  Patient is a LPN at [employer] and is referred to the office by her primary care provider Ferd Hibbs, NP for evaluation of new onset a-fib .  However, patient states that she does not have a history of atrial fibrillation.  She is referred for evaluation of shortness of breath.  Patient states that her symptoms started in January 2022 and have been getting progressively worse.  Patient states that she was started on Lasix 20 mg p.o. daily which did not improve her symptoms and therefore the dose of Lasix was increased to 40 mg p.o. daily.  Patient states that with the higher dose of Lasix she has less short of breath but if she were to forget taking the medications her day-to-day chores would be very difficult to perform.  She has a two-pillow orthopnea, denies lower extremity swelling or paroxysmal nocturnal dyspnea.  No hospitalizations of congestive heart failure.  Patient has had 2 COVID-19 infections 1 in May 2021 and the most recent in January 2022.  All the symptoms started after January 2022 and thinks there post Covid related.  Patient is still refusing COVID-19 vaccination because she states " many have died from it."  Her blood  pressure today is not at goal.  Patient states that for years her blood pressure has been around 140/100 mmHg and it has not been treated in the past.  Recently she was started on lisinopril and currently takes 2.5 mg p.o. twice daily.  Her last dose of lisinopril was yesterday 06/06/2020 at 8 PM.  No family history of premature coronary disease or sudden cardiac death.  FUNCTIONAL STATUS: Walks 3-4 miles at work but no structured exercise program or daily routine.   ALLERGIES: No Known Allergies  MEDICATION LIST PRIOR TO VISIT: Current Meds  Medication Sig  . acetaminophen (TYLENOL) 500 MG tablet Take 500 mg by mouth every 6 (six) hours as needed.  . furosemide (LASIX) 20 MG tablet Take 40 mg by mouth daily.  . metoprolol succinate (TOPROL-XL) 25 MG 24 hr tablet Take 1 tablet (25 mg total) by mouth daily. Take with or immediately following a meal.  . montelukast (SINGULAIR) 10 MG tablet Take 10 mg by mouth at bedtime.  . naproxen (NAPROSYN) 500 MG tablet Take 500 mg by mouth 2 (two) times daily with a meal.  . omeprazole (PRILOSEC) 20 MG capsule Take 20 mg by mouth daily.  . predniSONE (DELTASONE) 20 MG tablet Take 20 mg by mouth daily with breakfast.  . rosuvastatin (CRESTOR) 5 MG tablet Take 1 tablet (5 mg total) by mouth at bedtime.  . sacubitril-valsartan (ENTRESTO) 24-26 MG Take 1 tablet by mouth 2 (two) times daily.  . [DISCONTINUED] lisinopril (ZESTRIL) 5 MG tablet Take 5 mg by mouth daily.  . [  DISCONTINUED] potassium chloride SA (KLOR-CON) 20 MEQ tablet Take 20 mEq by mouth 2 (two) times daily.     PAST MEDICAL HISTORY: Past Medical History:  Diagnosis Date  . Asthma   . Cardiomyopathy ([place])   . CHF (congestive heart failure) ([place])   . Glaucoma   . History of COVID-19   . Hypertension   . Migraine     PAST SURGICAL HISTORY: Past Surgical History:  Procedure Laterality Date  . EYE SURGERY     glaucoma-multiple in childhood  . GANGLION CYST EXCISION Left 10/12/2011    FOOT    FAMILY HISTORY: The patient family history includes Cancer in her father; Diabetes in her maternal grandfather; Heart disease in her paternal grandmother; Hypertension in her maternal grandmother and mother; Pneumonia in her paternal grandfather; Seizures in her mother; Seizures (age of onset: 50) in her father; Stroke in her maternal grandmother and mother; Ulcers in her mother.  SOCIAL HISTORY:  The patient  reports that she has never smoked. She has never used smokeless tobacco. She reports previous alcohol use of about 1.0 standard drink of alcohol per week. She reports that she does not use drugs.  REVIEW OF SYSTEMS: Review of Systems  Constitutional: Negative for chills and fever.  HENT: Negative for hoarse voice and nosebleeds.   Eyes: Negative for discharge, double vision and pain.  Cardiovascular: Positive for dyspnea on exertion and orthopnea. Negative for chest pain, claudication, leg swelling, near-syncope, palpitations, paroxysmal nocturnal dyspnea and syncope.  Respiratory: Positive for shortness of breath. Negative for hemoptysis.   Musculoskeletal: Negative for muscle cramps and myalgias.  Gastrointestinal: Negative for abdominal pain, constipation, diarrhea, hematemesis, hematochezia, melena, nausea and vomiting.  Neurological: Negative for dizziness and light-headedness.    PHYSICAL EXAM: Vitals with BMI 06/07/2020 12/15/2018 12/15/2018  Height 5' 1.5" - -  Weight 196 lbs - -  BMI 30.16 - -  Systolic 010 932 355  Diastolic 87 92 84  Pulse 87 78 80    CONSTITUTIONAL: Well-developed and well-nourished. No acute distress.  SKIN: Skin is warm and dry. No rash noted. No cyanosis. No pallor. No jaundice HEAD: Normocephalic and atraumatic.  EYES: No scleral icterus MOUTH/THROAT: Moist oral membranes.  NECK: No JVD present. No thyromegaly noted. No carotid bruits  LYMPHATIC: No visible cervical adenopathy.  CHEST Normal respiratory effort. No intercostal  retractions  LUNGS: Clear to auscultation bilaterally.  No stridor. No wheezes. No rales.  CARDIOVASCULAR: Regular rate and rhythm, positive D3-U2, holosystolic murmur heard at the apex, without any gallops or rubs. ABDOMINAL: Obese, soft, nontender, nondistended, positive bowel sounds in all 4 quadrants.  No apparent ascites.  EXTREMITIES: No peripheral edema, 2+ dorsalis pedis and posterior tibial pulses bilaterally. HEMATOLOGIC: No significant bruising NEUROLOGIC: Oriented to person, place, and time. Nonfocal. Normal muscle tone.  PSYCHIATRIC: Normal mood and affect. Normal behavior. Cooperative  CARDIAC DATABASE: EKG: 12/17/2018: Normal sinus rhythm. Right superior axis deviation. Nonspecific intraventricular block. Abnormal ECG   Echocardiogram: 05/20/2020: Severely depressed LV systolic function with visual EF <20%. Left ventricle cavity is severely dilated. Hypokinetic global wall motion. Doppler evidence of grade III (restrictive) diastolic dysfunction, elevated LAP. Moderate (Grade II) mitral regurgitation. Moderate tricuspid regurgitation. No prior study for comparison.   Stress Testing: No results found for this or any previous visit from the past 1095 days.  Heart Catheterization: None  LABORATORY DATA: CBC Latest Ref Rng & Units 12/15/2018 02/23/2013 01/10/2013  WBC 4.0 - 10.5 K/uL 7.1 9.2 7.0  Hemoglobin 12.0 - 15.0  g/dL 13.7 14.6 13.2  Hematocrit 36.0 - 46.0 % 41.2 45.3 41.7  Platelets 150 - 400 K/uL 318 - -    CMP Latest Ref Rng & Units 12/15/2018 12/31/2012 08/13/2012  Glucose 70 - 99 mg/dL 92 82 79  BUN 6 - 20 mg/dL '18 10 8  ' Creatinine 0.44 - 1.00 mg/dL 1.03(H) 0.68 0.66  Sodium 135 - 145 mmol/L 141 141 139  Potassium 3.5 - 5.1 mmol/L 4.0 4.1 3.6  Chloride 98 - 111 mmol/L 109 105 105  CO2 22 - 32 mmol/L '22 26 26  ' Calcium 8.9 - 10.3 mg/dL 9.5 8.9 8.8  Total Protein 6.0 - 8.3 g/dL - 6.5 6.2  Total Bilirubin 0.3 - 1.2 mg/dL - 0.5 0.3  Alkaline Phos 39 - 117  U/L - 69 74  AST 0 - 37 U/L - 15 15  ALT 0 - 35 U/L - 14 15    Lipid Panel     Component Value Date/Time   CHOL 128 12/31/2012 0823   TRIG 56 12/31/2012 0823   HDL 50 12/31/2012 0823   CHOLHDL 2.6 12/31/2012 0823   VLDL 11 12/31/2012 0823   LDLCALC 67 12/31/2012 0823    No components found for: NTPROBNP No results for input(s): PROBNP in the last 8760 hours. No results for input(s): TSH in the last 8760 hours.  BMP No results for input(s): NA, K, CL, CO2, GLUCOSE, BUN, CREATININE, CALCIUM, GFRNONAA, GFRAA in the last 8760 hours.  HEMOGLOBIN A1C No results found for: HGBA1C, MPG   External Labs:  Date Collected: 05/03/2020 , information obtained by [facility] Potassium: 4.7 Creatinine 0.95 mg/dL. eGFR: 69 mL/min per 1.73 m Hemoglobin:  g/dL and hematocrit: 43.1 % Lipid profile: Total cholesterol 186 , triglycerides 66 , HDL 64 , LDL 110 AST: 22 , ALT: 32 , alkaline phosphatase: 97 BNP 691 (761 04/27/2020) TSH: 2.220  External labs: 06/03/2020: Records provided by the patient. Sodium 144, potassium 4.6, chloride 104, bicarb 22, creatinine 0.84, BUN 19, BNP 413  IMPRESSION:    ICD-10-CM   1. Chronic combined systolic and diastolic CHF, NYHA class 3 (HCC)  I50.42 EKG 12-Lead    sacubitril-valsartan (ENTRESTO) 24-26 MG    Basic metabolic panel    Pro b natriuretic peptide (BNP)    Magnesium    SARS-COV-2 RNA,(COVID-19) QUAL NAAT    CBC    metoprolol succinate (TOPROL-XL) 25 MG 24 hr tablet    Magnesium    Pro b natriuretic peptide (BNP)  2. Cardiomyopathy, unspecified type (HCC)  I42.9 rosuvastatin (CRESTOR) 5 MG tablet    metoprolol succinate (TOPROL-XL) 25 MG 24 hr tablet  3. History of COVID-19  Z86.16   4. LBBB (left bundle branch block)  I44.7      RECOMMENDATIONS: Marie Porter is a 59 y.o. female whose past medical history and cardiac risk factors include: Hx of COVID 46 infection (May 2021 and January 2022), chronic congestive heart failure,  cardiomyopathy, history of glaucoma, hypertension, obesity due to excess calories.  Chronic combined systolic and diastolic heart failure: Clinically I suspect patient has had congestive heart failure since January 2022.  However she is better compensated but not euvolemic (no JVP on physical examination or no LE swelling) but echo notes grade 3 diastolic impairment and increased LAP to suggest high filling pressures. Recommend diuresis.  In the past her BNP was greater than 800 most recent blood work from 06/03/2020 notes a BNP of 413 Last dose of lisinopril 06/06/2020.  Patient is asked  to hold lisinopril for 48 hours. Start Entresto 24/26 mg p.o. twice daily 06/09/2020. Blood work in 1 week to evaluate kidney function and electrolytes. Once she starts Entresto she is asked to reduce the Lasix to 20 mg p.o. daily Start Toprol-XL 25 mg p.o. daily Given the severely reduced cardiomyopathy recommend left and right heart catheterization. Will further uptitrate guideline directed medical therapy during the next several office visits.  Newly diagnosed cardiomyopathy: Most recent echocardiogram results including images reviewed with the patient at today's office visit. Commending a left and right heart catheterization to evaluate for possible ischemic burden and hemodynamics respectively. The procedure of left and right heart catheterization with possible intervention was explained to the patient in detail. The indication, alternatives, risks and benefits were reviewed.  Complications include but not limited to bleeding, infection, vascular injury, stroke, myocardial infection, arrhythmia, kidney injury requiring hemodialysis, given the underlying left bundle branch block potential for pacemaker, radiation-related injury in the case of prolonged fluoroscopy use, emergency cardiac surgery, and death. The patient understands the risks of serious complication is 1-2 in 8185 with diagnostic cardiac cath and  1-2% or less with angioplasty/stenting.  The patient voices understanding and provides verbal feedback and wishes to proceed with left and right coronary angiography with possible PCI.  History of COVID-19 infection: Patient has had Covid in May 2021 as well as January 2022. Patient is still adamant against the COVID-19 vaccination.   FINAL MEDICATION LIST END OF ENCOUNTER: Meds ordered this encounter  Medications  . sacubitril-valsartan (ENTRESTO) 24-26 MG    Sig: Take 1 tablet by mouth 2 (two) times daily.    Dispense:  60 tablet    Refill:  0  . rosuvastatin (CRESTOR) 5 MG tablet    Sig: Take 1 tablet (5 mg total) by mouth at bedtime.    Dispense:  90 tablet    Refill:  0  . metoprolol succinate (TOPROL-XL) 25 MG 24 hr tablet    Sig: Take 1 tablet (25 mg total) by mouth daily. Take with or immediately following a meal.    Dispense:  90 tablet    Refill:  0    Medications Discontinued During This Encounter  Medication Reason  . lisinopril (ZESTRIL) 5 MG tablet Change in therapy  . potassium chloride SA (KLOR-CON) 20 MEQ tablet Discontinued by provider     Current Outpatient Medications:  .  acetaminophen (TYLENOL) 500 MG tablet, Take 500 mg by mouth every 6 (six) hours as needed., Disp: , Rfl:  .  furosemide (LASIX) 20 MG tablet, Take 40 mg by mouth daily., Disp: , Rfl:  .  metoprolol succinate (TOPROL-XL) 25 MG 24 hr tablet, Take 1 tablet (25 mg total) by mouth daily. Take with or immediately following a meal., Disp: 90 tablet, Rfl: 0 .  montelukast (SINGULAIR) 10 MG tablet, Take 10 mg by mouth at bedtime., Disp: , Rfl:  .  naproxen (NAPROSYN) 500 MG tablet, Take 500 mg by mouth 2 (two) times daily with a meal., Disp: , Rfl:  .  omeprazole (PRILOSEC) 20 MG capsule, Take 20 mg by mouth daily., Disp: , Rfl:  .  predniSONE (DELTASONE) 20 MG tablet, Take 20 mg by mouth daily with breakfast., Disp: , Rfl:  .  rosuvastatin (CRESTOR) 5 MG tablet, Take 1 tablet (5 mg total) by mouth  at bedtime., Disp: 90 tablet, Rfl: 0 .  sacubitril-valsartan (ENTRESTO) 24-26 MG, Take 1 tablet by mouth 2 (two) times daily., Disp: 60 tablet, Rfl: 0  Orders  Placed This Encounter  Procedures  . SARS-COV-2 RNA,(COVID-19) QUAL NAAT  . Basic metabolic panel  . Pro b natriuretic peptide (BNP)  . Magnesium  . CBC  . EKG 12-Lead    There are no Patient Instructions on file for this visit.   --Continue cardiac medications as reconciled in final medication list. --Return in about 3 weeks (around 06/28/2020). Or sooner if needed. --Continue follow-up with your primary care physician regarding the management of your other chronic comorbid conditions.  Patient's questions and concerns were addressed to her satisfaction. She voices understanding of the instructions provided during this encounter.   This note was created using a voice recognition software as a result there may be grammatical errors inadvertently enclosed that do not reflect the nature of this encounter. Every attempt is made to correct such errors.  Rex Kras, [place], [facility]  Pager: [phone] Office: [phone]

## 2020-06-07 ENCOUNTER — Ambulatory Visit: Payer: PRIVATE HEALTH INSURANCE | Admitting: Cardiology

## 2020-06-07 ENCOUNTER — Other Ambulatory Visit: Payer: Self-pay

## 2020-06-07 ENCOUNTER — Encounter: Payer: Self-pay | Admitting: Cardiology

## 2020-06-07 VITALS — BP 137/87 | HR 87 | Ht 61.5 in | Wt 196.0 lb

## 2020-06-07 DIAGNOSIS — I429 Cardiomyopathy, unspecified: Secondary | ICD-10-CM

## 2020-06-07 DIAGNOSIS — I5042 Chronic combined systolic (congestive) and diastolic (congestive) heart failure: Secondary | ICD-10-CM

## 2020-06-07 DIAGNOSIS — I447 Left bundle-branch block, unspecified: Secondary | ICD-10-CM

## 2020-06-07 DIAGNOSIS — Z8616 Personal history of COVID-19: Secondary | ICD-10-CM

## 2020-06-07 MED ORDER — METOPROLOL SUCCINATE ER 25 MG PO TB24: 25.0000 mg | ORAL_TABLET | Freq: Every day | ORAL | 0 refills | Status: DC

## 2020-06-07 MED ORDER — ROSUVASTATIN CALCIUM 5 MG PO TABS: 5.0000 mg | ORAL_TABLET | Freq: Every day | ORAL | 0 refills | Status: DC

## 2020-06-07 MED ORDER — ENTRESTO 24-26 MG PO TABS: 1.0000 | ORAL_TABLET | Freq: Two times a day (BID) | ORAL | 0 refills | Status: DC

## 2020-06-10 ENCOUNTER — Telehealth: Payer: Self-pay

## 2020-06-10 NOTE — Telephone Encounter (Signed)
Patient called and asked if you think she could be able to attend a music festival on Sunday after her cath?

## 2020-06-10 NOTE — Telephone Encounter (Signed)
Lets see what we find on the heart cath. Great question after the heart cath.

## 2020-06-11 NOTE — Telephone Encounter (Signed)
pt aware 

## 2020-06-17 ENCOUNTER — Institutional Professional Consult (permissible substitution): Payer: Self-pay | Admitting: Pulmonary Disease

## 2020-06-18 ENCOUNTER — Other Ambulatory Visit: Payer: Self-pay

## 2020-06-18 ENCOUNTER — Encounter (HOSPITAL_COMMUNITY)
Admission: RE | Disposition: A | Discharge status: Home / Self Care | Payer: Self-pay | Source: Home / Self Care | Attending: Cardiology

## 2020-06-18 ENCOUNTER — Ambulatory Visit (HOSPITAL_COMMUNITY)
Admission: RE | Admit: 2020-06-18 | Discharge: 2020-06-18 | Disposition: A | Discharge status: Home / Self Care | Payer: Self-pay | Attending: Cardiology | Admitting: Cardiology

## 2020-06-18 DIAGNOSIS — I110 Hypertensive heart disease with heart failure: Secondary | ICD-10-CM | POA: Diagnosis not present

## 2020-06-18 DIAGNOSIS — I5020 Unspecified systolic (congestive) heart failure: Secondary | ICD-10-CM | POA: Diagnosis present

## 2020-06-18 DIAGNOSIS — I5042 Chronic combined systolic (congestive) and diastolic (congestive) heart failure: Secondary | ICD-10-CM | POA: Insufficient documentation

## 2020-06-18 DIAGNOSIS — Z8616 Personal history of COVID-19: Secondary | ICD-10-CM | POA: Diagnosis not present

## 2020-06-18 DIAGNOSIS — Z79899 Other long term (current) drug therapy: Secondary | ICD-10-CM | POA: Diagnosis not present

## 2020-06-18 DIAGNOSIS — I428 Other cardiomyopathies: Secondary | ICD-10-CM | POA: Diagnosis not present

## 2020-06-18 DIAGNOSIS — I5022 Chronic systolic (congestive) heart failure: Secondary | ICD-10-CM

## 2020-06-18 HISTORY — PX: RIGHT/LEFT HEART CATH AND CORONARY ANGIOGRAPHY: CATH118266

## 2020-06-18 LAB — POCT I-STAT EG7
Acid-Base Excess: 1 mmol/L (ref 0.0–2.0)
Bicarbonate: 26.5 mmol/L (ref 20.0–28.0)
Calcium, Ion: 1.25 mmol/L (ref 1.15–1.40)
HCT: 40 % (ref 36.0–46.0)
Hemoglobin: 13.6 g/dL (ref 12.0–15.0)
O2 Saturation: 66 %
Potassium: 3.6 mmol/L (ref 3.5–5.1)
Sodium: 143 mmol/L (ref 135–145)
TCO2: 28 mmol/L (ref 22–32)
pCO2, Ven: 45.4 mmHg (ref 44.0–60.0)
pH, Ven: 7.374 (ref 7.250–7.430)
pO2, Ven: 36 mmHg (ref 32.0–45.0)

## 2020-06-18 SURGERY — RIGHT/LEFT HEART CATH AND CORONARY ANGIOGRAPHY
Anesthesia: LOCAL

## 2020-06-18 MED ORDER — SODIUM CHLORIDE 0.9 % IV SOLN: INTRAVENOUS | Status: AC

## 2020-06-18 MED ORDER — LIDOCAINE HCL (PF) 1 % IJ SOLN
INTRAMUSCULAR | Status: AC
  Filled 2020-06-18: qty 30

## 2020-06-18 MED ORDER — ONDANSETRON HCL 4 MG/2ML IJ SOLN: 4.0000 mg | Freq: Four times a day (QID) | INTRAMUSCULAR | Status: DC | PRN

## 2020-06-18 MED ORDER — HEPARIN SODIUM (PORCINE) 1000 UNIT/ML IJ SOLN
INTRAMUSCULAR | Status: AC
  Filled 2020-06-18: qty 1

## 2020-06-18 MED ORDER — SODIUM CHLORIDE 0.9% FLUSH: 3.0000 mL | INTRAVENOUS | Status: DC | PRN

## 2020-06-18 MED ORDER — FENTANYL CITRATE (PF) 100 MCG/2ML IJ SOLN
INTRAMUSCULAR | Status: DC | PRN
  Administered 2020-06-18 (×2): 25 ug via INTRAVENOUS

## 2020-06-18 MED ORDER — SODIUM CHLORIDE 0.9% FLUSH: 3.0000 mL | Freq: Two times a day (BID) | INTRAVENOUS | Status: DC

## 2020-06-18 MED ORDER — IOHEXOL 350 MG/ML SOLN
INTRAVENOUS | Status: DC | PRN
  Administered 2020-06-18: 45 mL

## 2020-06-18 MED ORDER — MIDAZOLAM HCL 2 MG/2ML IJ SOLN
INTRAMUSCULAR | Status: AC
  Filled 2020-06-18: qty 2

## 2020-06-18 MED ORDER — SODIUM CHLORIDE 0.9 % IV SOLN: 250.0000 mL | INTRAVENOUS | Status: DC | PRN

## 2020-06-18 MED ORDER — VERAPAMIL HCL 2.5 MG/ML IV SOLN
INTRAVENOUS | Status: DC | PRN
  Administered 2020-06-18: 5 mL via INTRA_ARTERIAL

## 2020-06-18 MED ORDER — MIDAZOLAM HCL 2 MG/2ML IJ SOLN
INTRAMUSCULAR | Status: DC | PRN
  Administered 2020-06-18: 1 mg via INTRAVENOUS

## 2020-06-18 MED ORDER — VERAPAMIL HCL 2.5 MG/ML IV SOLN
INTRAVENOUS | Status: AC
  Filled 2020-06-18: qty 2

## 2020-06-18 MED ORDER — NITROGLYCERIN 1 MG/10 ML FOR IR/CATH LAB
INTRA_ARTERIAL | Status: AC
  Filled 2020-06-18: qty 10

## 2020-06-18 MED ORDER — HEPARIN (PORCINE) IN NACL 1000-0.9 UT/500ML-% IV SOLN
INTRAVENOUS | Status: AC
  Filled 2020-06-18: qty 500

## 2020-06-18 MED ORDER — HYDRALAZINE HCL 20 MG/ML IJ SOLN: 10.0000 mg | INTRAMUSCULAR | Status: DC | PRN

## 2020-06-18 MED ORDER — ACETAMINOPHEN 325 MG PO TABS: 650.0000 mg | ORAL_TABLET | ORAL | Status: DC | PRN

## 2020-06-18 MED ORDER — SODIUM CHLORIDE 0.9 % IV SOLN: INTRAVENOUS | Status: DC

## 2020-06-18 MED ORDER — LABETALOL HCL 5 MG/ML IV SOLN: 10.0000 mg | INTRAVENOUS | Status: DC | PRN

## 2020-06-18 MED ORDER — LIDOCAINE HCL (PF) 1 % IJ SOLN
INTRAMUSCULAR | Status: DC | PRN
  Administered 2020-06-18: 5 mL

## 2020-06-18 MED ORDER — NITROGLYCERIN 1 MG/10 ML FOR IR/CATH LAB
INTRA_ARTERIAL | Status: DC | PRN
  Administered 2020-06-18: 100 ug via INTRA_ARTERIAL

## 2020-06-18 MED ORDER — FENTANYL CITRATE (PF) 100 MCG/2ML IJ SOLN
INTRAMUSCULAR | Status: AC
  Filled 2020-06-18: qty 2

## 2020-06-18 MED ORDER — HEPARIN (PORCINE) IN NACL 1000-0.9 UT/500ML-% IV SOLN
INTRAVENOUS | Status: DC | PRN
  Administered 2020-06-18 (×2): 500 mL

## 2020-06-18 MED ORDER — HEPARIN SODIUM (PORCINE) 1000 UNIT/ML IJ SOLN
INTRAMUSCULAR | Status: DC | PRN
  Administered 2020-06-18: 4000 [IU] via INTRAVENOUS

## 2020-06-18 MED ORDER — ASPIRIN 81 MG PO CHEW
81.0000 mg | CHEWABLE_TABLET | ORAL | Status: AC
  Administered 2020-06-18: 81 mg via ORAL
  Filled 2020-06-18: qty 1

## 2020-06-18 SURGICAL SUPPLY — 15 items
CATH INFINITI 5 FR 3DRC (CATHETERS) ×1 IMPLANT
CATH INFINITI 5 FR AR1 MOD (CATHETERS) ×1 IMPLANT
CATH INFINITI JR4 5F (CATHETERS) ×1 IMPLANT
CATH OPTITORQUE TIG 4.0 5F (CATHETERS) ×1 IMPLANT
CATH SWAN GANZ 7F STRAIGHT (CATHETERS) ×1 IMPLANT
DEVICE RAD COMP TR BAND LRG (VASCULAR PRODUCTS) ×1 IMPLANT
GLIDESHEATH SLEND A-KIT 6F 22G (SHEATH) ×1 IMPLANT
GUIDEWIRE INQWIRE 1.5J.035X260 (WIRE) IMPLANT
INQWIRE 1.5J .035X260CM (WIRE) ×2
KIT HEART LEFT (KITS) ×2 IMPLANT
PACK CARDIAC CATHETERIZATION (CUSTOM PROCEDURE TRAY) ×2 IMPLANT
SHEATH PROBE COVER 6X72 (BAG) ×1 IMPLANT
TRANSDUCER W/STOPCOCK (MISCELLANEOUS) ×2 IMPLANT
TUBING CIL FLEX 10 FLL-RA (TUBING) ×2 IMPLANT
WIRE EMERALD 3MM-J .025X260CM (WIRE) ×1 IMPLANT

## 2020-06-18 NOTE — H&P (Signed)
OV 4/18 copied for documentation    Date:  06/18/2020   ID:  Marie Porter, DOB 10-06-1960, MRN [id number]  PCP:  Ferd Hibbs, NP  Cardiologist: Rex Kras, DO, [facility] (established care 06/07/2020)  REASON FOR CONSULT: New onset atrial fibrillation  REQUESTING PHYSICIAN:  No referring provider defined for this encounter.  No chief complaint on file.   HPI  Marie Porter is a 60 y.o. female who presents to the office with a chief complaint of " shortness of breath." Patient's past medical history and cardiovascular risk factors include: Hx of COVID 19 infection (May 2021 and January 2022), chronic congestive heart failure, cardiomyopathy, history of glaucoma, hypertension, obesity due to excess calories.  Patient is a LPN at [employer] and is referred to the office by her primary care provider No ref. provider found for evaluation of new onset a-fib .  However, patient states that she does not have a history of atrial fibrillation.  She is referred for evaluation of shortness of breath.  Patient states that her symptoms started in January 2022 and have been getting progressively worse.  Patient states that she was started on Lasix 20 mg p.o. daily which did not improve her symptoms and therefore the dose of Lasix was increased to 40 mg p.o. daily.  Patient states that with the higher dose of Lasix she has less short of breath but if she were to forget taking the medications her day-to-day chores would be very difficult to perform.  She has a two-pillow orthopnea, denies lower extremity swelling or paroxysmal nocturnal dyspnea.  No hospitalizations of congestive heart failure.  Patient has had 2 COVID-19 infections 1 in May 2021 and the most recent in January 2022.  All the symptoms started after January 2022 and thinks there post Covid related.  Patient is still refusing COVID-19 vaccination because she states " many have died from it."  Her blood pressure  today is not at goal.  Patient states that for years her blood pressure has been around 140/100 mmHg and it has not been treated in the past.  Recently she was started on lisinopril and currently takes 2.5 mg p.o. twice daily.  Her last dose of lisinopril was yesterday 06/06/2020 at 8 PM.  No family history of premature coronary disease or sudden cardiac death.  FUNCTIONAL STATUS: Walks 3-4 miles at work but no structured exercise program or daily routine.   ALLERGIES: No Known Allergies  MEDICATION LIST PRIOR TO VISIT: Current Meds  Medication Sig  . acetaminophen (TYLENOL) 500 MG tablet Take 500 mg by mouth every 6 (six) hours as needed for moderate pain.  . furosemide (LASIX) 20 MG tablet Take 20 mg by mouth daily.  . metoprolol succinate (TOPROL-XL) 25 MG 24 hr tablet Take 1 tablet (25 mg total) by mouth daily. Take with or immediately following a meal.  . omeprazole (PRILOSEC) 20 MG capsule Take 20 mg by mouth daily as needed (heart burn).  . rosuvastatin (CRESTOR) 5 MG tablet Take 1 tablet (5 mg total) by mouth at bedtime.     PAST MEDICAL HISTORY: Past Medical History:  Diagnosis Date  . Asthma   . Cardiomyopathy ([place])   . CHF (congestive heart failure) ([place])   . Glaucoma   . History of COVID-19   . Hypertension   . Migraine     PAST SURGICAL HISTORY: Past Surgical History:  Procedure Laterality Date  . EYE SURGERY     glaucoma-multiple in childhood  .  GANGLION CYST EXCISION Left 10/12/2011   FOOT    FAMILY HISTORY: The patient family history includes Cancer in her father; Diabetes in her maternal grandfather; Heart disease in her paternal grandmother; Hypertension in her maternal grandmother and mother; Pneumonia in her paternal grandfather; Seizures in her mother; Seizures (age of onset: 68) in her father; Stroke in her maternal grandmother and mother; Ulcers in her mother.  SOCIAL HISTORY:  The patient  reports that she has never smoked. She has never used  smokeless tobacco. She reports previous alcohol use of about 1.0 standard drink of alcohol per week. She reports that she does not use drugs.  REVIEW OF SYSTEMS: Review of Systems  Constitutional: Negative for chills and fever.  HENT: Negative for hoarse voice and nosebleeds.   Eyes: Negative for discharge, double vision and pain.  Cardiovascular: Positive for dyspnea on exertion and orthopnea. Negative for chest pain, claudication, leg swelling, near-syncope, palpitations, paroxysmal nocturnal dyspnea and syncope.  Respiratory: Positive for shortness of breath. Negative for hemoptysis.   Musculoskeletal: Negative for muscle cramps and myalgias.  Gastrointestinal: Negative for abdominal pain, constipation, diarrhea, hematemesis, hematochezia, melena, nausea and vomiting.  Neurological: Negative for dizziness and light-headedness.    PHYSICAL EXAM: Vitals with BMI 06/18/2020 06/07/2020 12/15/2018  Height 5' 1.5" 5' 1.5" -  Weight 193 lbs 196 lbs -  BMI 28.36 62.94 -  Systolic 765 465 035  Diastolic 94 87 92  Pulse 68 87 78    CONSTITUTIONAL: Well-developed and well-nourished. No acute distress.  SKIN: Skin is warm and dry. No rash noted. No cyanosis. No pallor. No jaundice HEAD: Normocephalic and atraumatic.  EYES: No scleral icterus MOUTH/THROAT: Moist oral membranes.  NECK: No JVD present. No thyromegaly noted. No carotid bruits  LYMPHATIC: No visible cervical adenopathy.  CHEST Normal respiratory effort. No intercostal retractions  LUNGS: Clear to auscultation bilaterally.  No stridor. No wheezes. No rales.  CARDIOVASCULAR: Regular rate and rhythm, positive W6-F6, holosystolic murmur heard at the apex, without any gallops or rubs. ABDOMINAL: Obese, soft, nontender, nondistended, positive bowel sounds in all 4 quadrants.  No apparent ascites.  EXTREMITIES: No peripheral edema, 2+ dorsalis pedis and posterior tibial pulses bilaterally. HEMATOLOGIC: No significant  bruising NEUROLOGIC: Oriented to person, place, and time. Nonfocal. Normal muscle tone.  PSYCHIATRIC: Normal mood and affect. Normal behavior. Cooperative  CARDIAC DATABASE: EKG: 12/17/2018: Normal sinus rhythm. Right superior axis deviation. Nonspecific intraventricular block. Abnormal ECG   Echocardiogram: 05/20/2020: Severely depressed LV systolic function with visual EF <20%. Left ventricle cavity is severely dilated. Hypokinetic global wall motion. Doppler evidence of grade III (restrictive) diastolic dysfunction, elevated LAP. Moderate (Grade II) mitral regurgitation. Moderate tricuspid regurgitation. No prior study for comparison.   Stress Testing: No results found for this or any previous visit from the past 1095 days.  Heart Catheterization: None  LABORATORY DATA: CBC Latest Ref Rng & Units 12/15/2018 02/23/2013 01/10/2013  WBC 4.0 - 10.5 K/uL 7.1 9.2 7.0  Hemoglobin 12.0 - 15.0 g/dL 13.7 14.6 13.2  Hematocrit 36.0 - 46.0 % 41.2 45.3 41.7  Platelets 150 - 400 K/uL 318 - -    CMP Latest Ref Rng & Units 12/15/2018 12/31/2012 08/13/2012  Glucose 70 - 99 mg/dL 92 82 79  BUN 6 - 20 mg/dL '18 10 8  ' Creatinine 0.44 - 1.00 mg/dL 1.03(H) 0.68 0.66  Sodium 135 - 145 mmol/L 141 141 139  Potassium 3.5 - 5.1 mmol/L 4.0 4.1 3.6  Chloride 98 - 111 mmol/L 109 105 105  CO2 22 - 32 mmol/L '22 26 26  ' Calcium 8.9 - 10.3 mg/dL 9.5 8.9 8.8  Total Protein 6.0 - 8.3 g/dL - 6.5 6.2  Total Bilirubin 0.3 - 1.2 mg/dL - 0.5 0.3  Alkaline Phos 39 - 117 U/L - 69 74  AST 0 - 37 U/L - 15 15  ALT 0 - 35 U/L - 14 15    Lipid Panel     Component Value Date/Time   CHOL 128 12/31/2012 0823   TRIG 56 12/31/2012 0823   HDL 50 12/31/2012 0823   CHOLHDL 2.6 12/31/2012 0823   VLDL 11 12/31/2012 0823   LDLCALC 67 12/31/2012 0823    No components found for: NTPROBNP No results for input(s): PROBNP in the last 8760 hours. No results for input(s): TSH in the last 8760 hours.  BMP No results for  input(s): NA, K, CL, CO2, GLUCOSE, BUN, CREATININE, CALCIUM, GFRNONAA, GFRAA in the last 8760 hours.  HEMOGLOBIN A1C No results found for: HGBA1C, MPG   External Labs:  Date Collected: 05/03/2020 , information obtained by [facility] Potassium: 4.7 Creatinine 0.95 mg/dL. eGFR: 69 mL/min per 1.73 m Hemoglobin:  g/dL and hematocrit: 43.1 % Lipid profile: Total cholesterol 186 , triglycerides 66 , HDL 64 , LDL 110 AST: 22 , ALT: 32 , alkaline phosphatase: 97 BNP 691 (761 04/27/2020) TSH: 2.220  External labs: 06/03/2020: Records provided by the patient. Sodium 144, potassium 4.6, chloride 104, bicarb 22, creatinine 0.84, BUN 19, BNP 413  IMPRESSION:  No diagnosis found.   RECOMMENDATIONS: Marie Porter is a 60 y.o. female whose past medical history and cardiac risk factors include: Hx of COVID 51 infection (May 2021 and January 2022), chronic congestive heart failure, cardiomyopathy, history of glaucoma, hypertension, obesity due to excess calories.  Chronic combined systolic and diastolic heart failure: Clinically I suspect patient has had congestive heart failure since January 2022.  However she is better compensated but not euvolemic (no JVP on physical examination or no LE swelling) but echo notes grade 3 diastolic impairment and increased LAP to suggest high filling pressures. Recommend diuresis.  In the past her BNP was greater than 800 most recent blood work from 06/03/2020 notes a BNP of 413 Last dose of lisinopril 06/06/2020.  Patient is asked to hold lisinopril for 48 hours. Start Entresto 24/26 mg p.o. twice daily 06/09/2020. Blood work in 1 week to evaluate kidney function and electrolytes. Once she starts Entresto she is asked to reduce the Lasix to 20 mg p.o. daily Start Toprol-XL 25 mg p.o. daily Given the severely reduced cardiomyopathy recommend left and right heart catheterization. Will further uptitrate guideline directed medical therapy during the next several office  visits.  Newly diagnosed cardiomyopathy: Most recent echocardiogram results including images reviewed with the patient at today's office visit. Commending a left and right heart catheterization to evaluate for possible ischemic burden and hemodynamics respectively. The procedure of left and right heart catheterization with possible intervention was explained to the patient in detail. The indication, alternatives, risks and benefits were reviewed.  Complications include but not limited to bleeding, infection, vascular injury, stroke, myocardial infection, arrhythmia, kidney injury requiring hemodialysis, given the underlying left bundle branch block potential for pacemaker, radiation-related injury in the case of prolonged fluoroscopy use, emergency cardiac surgery, and death. The patient understands the risks of serious complication is 1-2 in 2947 with diagnostic cardiac cath and 1-2% or less with angioplasty/stenting.  The patient voices understanding and provides verbal feedback and wishes to proceed  with left and right coronary angiography with possible PCI.  History of COVID-19 infection: Patient has had Covid in May 2021 as well as January 2022. Patient is still adamant against the COVID-19 vaccination.   FINAL MEDICATION LIST END OF ENCOUNTER: No orders of the defined types were placed in this encounter.   Medications Discontinued During This Encounter  Medication Reason  . predniSONE (DELTASONE) 20 MG tablet Patient Preference  . naproxen (NAPROSYN) 500 MG tablet Patient Preference  . montelukast (SINGULAIR) 10 MG tablet Patient Preference    No current facility-administered medications for this encounter.  No orders of the defined types were placed in this encounter.   There are no outpatient Patient Instructions on file for this admission.   --Continue cardiac medications as reconciled in final medication list. --No follow-ups on file. Or sooner if needed. --Continue  follow-up with your primary care physician regarding the management of your other chronic comorbid conditions.  Patient's questions and concerns were addressed to her satisfaction. She voices understanding of the instructions provided during this encounter.   This note was created using a voice recognition software as a result there may be grammatical errors inadvertently enclosed that do not reflect the nature of this encounter. Every attempt is made to correct such errors.  Rex Kras, [place], [facility]  Pager: [phone] Office: [phone]

## 2020-06-18 NOTE — Discharge Instructions (Signed)
Radial Site Care  This sheet gives you information about how to care for yourself after your procedure. Your health care provider may also give you more specific instructions. If you have problems or questions, contact your health care provider. What can I expect after the procedure? After the procedure, it is common to have:  Bruising and tenderness at the catheter insertion area. Follow these instructions at home: Medicines  Take over-the-counter and prescription medicines only as told by your health care provider. Insertion site care 1. Follow instructions from your health care provider about how to take care of your insertion site. Make sure you: ? Wash your hands with soap and water before you remove your bandage (dressing). If soap and water are not available, use hand sanitizer. ? May remove dressing in 24 hours. 2. Check your insertion site every day for signs of infection. Check for: ? Redness, swelling, or pain. ? Fluid or blood. ? Pus or a bad smell. ? Warmth. 3. Do no take baths, swim, or use a hot tub for 5 days. 4. You may shower 24-48 hours after the procedure. ? Remove the dressing and gently wash the site with plain soap and water. ? Pat the area dry with a clean towel. ? Do not rub the site. That could cause bleeding. 5. Do not apply powder or lotion to the site. Activity  1. For 24 hours after the procedure, or as directed by your health care provider: ? Do not flex or bend the affected arm. ? Do not push or pull heavy objects with the affected arm. ? Do not drive yourself home from the hospital or clinic. You may drive 24 hours after the procedure. ? Do not operate machinery or power tools. ? KEEP ARM ELEVATED THE REMAINDER OF THE DAY. 2. Do not push, pull or lift anything that is heavier than 10 lb for 5 days. 3. Ask your health care provider when it is okay to: ? Return to work or school. ? Resume usual physical activities or sports. ? Resume sexual  activity. General instructions  If the catheter site starts to bleed, raise your arm and put firm pressure on the site. If the bleeding does not stop, get help right away. This is a medical emergency.  DRINK PLENTY OF FLUIDS FOR THE NEXT 2-3 DAYS.  No alcohol consumption for 24 hours after receiving sedation.  If you went home on the same day as your procedure, a responsible adult should be with you for the first 24 hours after you arrive home.  Keep all follow-up visits as told by your health care provider. This is important. Contact a health care provider if:  You have a fever.  You have redness, swelling, or yellow drainage around your insertion site. Get help right away if:  You have unusual pain at the radial site.  The catheter insertion area swells very fast.  The insertion area is bleeding, and the bleeding does not stop when you hold steady pressure on the area.  Your arm or hand becomes pale, cool, tingly, or numb. These symptoms may represent a serious problem that is an emergency. Do not wait to see if the symptoms will go away. Get medical help right away. Call your local emergency services (911 in the [place] Do not drive yourself to the hospital. Summary  After the procedure, it is common to have bruising and tenderness at the site.  Follow instructions from your health care provider about how to take care   of your radial site wound. Check the wound every day for signs of infection.  This information is not intended to replace advice given to you by your health care provider. Make sure you discuss any questions you have with your health care provider. Document Revised: 03/14/2017 Document Reviewed: 03/14/2017 Elsevier Patient Education  2020 [employer] 

## 2020-06-18 NOTE — Interval H&P Note (Signed)
History and Physical Interval Note:  06/18/2020 1:20 PM  Marie Porter  has presented today for surgery, with the diagnosis of shortness of breath  - cardiomyopathy - heart failure.  The various methods of treatment have been discussed with the patient and family. After consideration of risks, benefits and other options for treatment, the patient has consented to  Procedure(s): RIGHT/LEFT HEART CATH AND CORONARY ANGIOGRAPHY (N/A) as a surgical intervention.  The patient's history has been reviewed, patient examined, no change in status, stable for surgery.  I have reviewed the patient's chart and labs.  Questions were answered to the patient's satisfaction.     2012 Appropriate Use Criteria for Diagnostic Catheterization Home / Select Test of Interest Indication for [facility] Cardiomyopathies Cardiomyopathies (Right and Left Heart Catheterization OR Right Heart Catheterization Alone With/Wit Cardiomyopathies  (Right and Left Heart Catheterization OR  Right Heart Catheterization Alone With/Without Left Ventriculography and Coronary Angiography)  Link Here: [email or web address] Indication:  Known or suspected cardiomyopathy with or without heart failure A (7) Indication: 93; Score 7   [name] J Kerra Guilfoil

## 2020-06-18 NOTE — Progress Notes (Signed)
Discharge instructions reviewed with pt and her sister in law (via telephone) both voice understanding.

## 2020-06-18 NOTE — Progress Notes (Signed)
Pt brought a copy of lab results from [facility].  Placed in chart

## 2020-06-21 ENCOUNTER — Encounter (HOSPITAL_COMMUNITY): Payer: Self-pay | Admitting: Cardiology

## 2020-06-23 ENCOUNTER — Telehealth: Payer: Self-pay

## 2020-06-23 NOTE — Telephone Encounter (Signed)
Patient called and stated that had a cath Friday April 29th and was recently prescribed :    Patient and advised to monitor weight and BP every morning and this morning she was 196 and BP was 116/60. Her question is should she continue all these medications. Patient described the way she is currently feeling as " walking through wet cement. Patient is currently on schedule for Wed, May 11th. Please advise.   Patient cell number : [phone]

## 2020-06-28 NOTE — Telephone Encounter (Signed)
Please call her back and I dont mind speaking to her over the phone.

## 2020-06-30 ENCOUNTER — Ambulatory Visit: Payer: PRIVATE HEALTH INSURANCE | Admitting: Cardiology

## 2020-06-30 ENCOUNTER — Other Ambulatory Visit: Payer: Self-pay

## 2020-06-30 ENCOUNTER — Encounter: Payer: Self-pay | Admitting: Cardiology

## 2020-06-30 VITALS — BP 127/90 | HR 70 | Temp 96.6°F | Resp 16 | Ht 61.0 in | Wt 199.8 lb

## 2020-06-30 DIAGNOSIS — I447 Left bundle-branch block, unspecified: Secondary | ICD-10-CM

## 2020-06-30 DIAGNOSIS — Z8616 Personal history of COVID-19: Secondary | ICD-10-CM

## 2020-06-30 DIAGNOSIS — I428 Other cardiomyopathies: Secondary | ICD-10-CM

## 2020-06-30 DIAGNOSIS — I5042 Chronic combined systolic (congestive) and diastolic (congestive) heart failure: Secondary | ICD-10-CM

## 2020-06-30 MED ORDER — ENTRESTO 49-51 MG PO TABS: 1.0000 | ORAL_TABLET | Freq: Two times a day (BID) | ORAL | 0 refills | Status: DC

## 2020-06-30 NOTE — Progress Notes (Signed)
Date:  06/30/2020   ID:  [name], DOB [birth year], MRN [id number]  PCP:  Ferd Hibbs, NP  Cardiologist: Rex Kras, DO, [facility] (established care 06/07/2020)  Date: 06/30/20 Last Office Visit: 06/07/2020  Chief Complaint  Patient presents with  . Post cath   . Follow-up  . Congestive Heart Failure  . Shortness of Breath    HPI  Marie Porter is a 60 y.o. female who presents to the office with a chief complaint of " shortness of breath, heart failure management, and post heart cath follow up." Patient's past medical history and cardiovascular risk factors include: Hx of COVID 19 infection (May 2021 and January 2022), chronic congestive heart failure, cardiomyopathy, history of glaucoma, hypertension, obesity due to excess calories.  Patient is a LPN at [employer] and is referred to the office by her PCP Ferd Hibbs, NP for Afib; however, she does not have Afib and is being followed for NICMP .  Patient has had patient has had 2 COVID 19 infections most recent one was in Jan 2022. Since January she has been having shortness of breath, 2 pillow orthopnea, and PND. She had an echo which noted severely reduced LVEF and is now referred to cardiology. At the last office visit she was started on Entresto and schedulde for heart cath given her newly discovered cardiomyopathy.   Cath findings discussed with the patient during today's encounter. Clinically, she has dyspnea w/ exertion, weight gain, orthopnea and some PND. Most recent labs independently reviewed and noted below for reference. She has reduced her salt and fluid intake since last OV.   She denies chest pain or angina pectoris.   No family history of premature coronary disease or sudden cardiac death.  FUNCTIONAL STATUS: Walks 3-4 miles at work but no structured exercise program or daily routine.   ALLERGIES: No Known Allergies  MEDICATION LIST PRIOR TO VISIT: Current Meds  Medication  Sig  . acetaminophen (TYLENOL) 500 MG tablet Take 500 mg by mouth every 6 (six) hours as needed for moderate pain.  . furosemide (LASIX) 20 MG tablet Take 20 mg by mouth daily.  . metoprolol succinate (TOPROL-XL) 25 MG 24 hr tablet Take 1 tablet (25 mg total) by mouth daily. Take with or immediately following a meal.  . omeprazole (PRILOSEC) 20 MG capsule Take 20 mg by mouth daily as needed (heart burn).  . rosuvastatin (CRESTOR) 5 MG tablet Take 1 tablet (5 mg total) by mouth at bedtime.  . sacubitril-valsartan (ENTRESTO) 49-51 MG Take 1 tablet by mouth 2 (two) times daily.  . [DISCONTINUED] sacubitril-valsartan (ENTRESTO) 24-26 MG Take 1 tablet by mouth 2 (two) times daily.     PAST MEDICAL HISTORY: Past Medical History:  Diagnosis Date  . Asthma   . Cardiomyopathy ([place])   . CHF (congestive heart failure) ([place])   . Glaucoma   . History of COVID-19   . Hypertension   . Migraine     PAST SURGICAL HISTORY: Past Surgical History:  Procedure Laterality Date  . EYE SURGERY     glaucoma-multiple in childhood  . GANGLION CYST EXCISION Left 10/12/2011   FOOT  . RIGHT/LEFT HEART CATH AND CORONARY ANGIOGRAPHY N/A 06/18/2020   Procedure: RIGHT/LEFT HEART CATH AND CORONARY ANGIOGRAPHY;  Surgeon: Nigel Mormon, MD;  Location: [place] [facility];  Service: Cardiovascular;  Laterality: N/A;    FAMILY HISTORY: The patient family history includes Cancer in her father; Diabetes in her maternal grandfather; Heart disease  in her paternal grandmother; Hypertension in her maternal grandmother and mother; Pneumonia in her paternal grandfather; Seizures in her mother; Seizures (age of onset: 72) in her father; Stroke in her maternal grandmother and mother; Ulcers in her mother.  SOCIAL HISTORY:  The patient  reports that she has never smoked. She has never used smokeless tobacco. She reports previous alcohol use of about 1.0 standard drink of alcohol per week. She reports that she does not use  drugs.  REVIEW OF SYSTEMS: Review of Systems  Constitutional: Negative for chills and fever.  HENT: Negative for hoarse voice and nosebleeds.   Eyes: Negative for discharge, double vision and pain.  Cardiovascular: Positive for dyspnea on exertion, orthopnea and paroxysmal nocturnal dyspnea. Negative for chest pain, claudication, leg swelling, near-syncope, palpitations and syncope.  Respiratory: Negative for hemoptysis and shortness of breath.   Musculoskeletal: Negative for muscle cramps and myalgias.  Gastrointestinal: Negative for abdominal pain, constipation, diarrhea, hematemesis, hematochezia, melena, nausea and vomiting.  Neurological: Negative for dizziness and light-headedness.    PHYSICAL EXAM: Vitals with BMI 06/30/2020 06/18/2020 06/18/2020  Height _0  - -  Weight 199 lbs 13 oz - -  BMI 50.09 - -  Systolic 381 829 92  Diastolic 90 70 70  Pulse 70 73 63    CONSTITUTIONAL: Well-developed and well-nourished. No acute distress.  SKIN: Skin is warm and dry. No rash noted. No cyanosis. No pallor. No jaundice HEAD: Normocephalic and atraumatic.  EYES: No scleral icterus MOUTH/THROAT: Moist oral membranes.  NECK: No JVD present. No thyromegaly noted. No carotid bruits  LYMPHATIC: No visible cervical adenopathy.  CHEST Normal respiratory effort. No intercostal retractions  LUNGS: Clear to auscultation bilaterally.  No stridor. No wheezes. No rales.  CARDIOVASCULAR: Regular rate and rhythm, positive H3-Z1, holosystolic murmur heard at the apex, without any gallops or rubs. ABDOMINAL: Obese, soft, nontender, nondistended, positive bowel sounds in all 4 quadrants.  No apparent ascites.  EXTREMITIES: No peripheral edema, 2+ dorsalis pedis and posterior tibial pulses bilaterally. HEMATOLOGIC: No significant bruising NEUROLOGIC: Oriented to person, place, and time. Nonfocal. Normal muscle tone.  PSYCHIATRIC: Normal mood and affect. Normal behavior. Cooperative  CARDIAC  DATABASE: EKG: 12/17/2018: Normal sinus rhythm. Right superior axis deviation. Nonspecific intraventricular block. Abnormal ECG   Echocardiogram: 05/20/2020: Severely depressed LV systolic function with visual EF <20%. Left ventricle cavity is severely dilated. Hypokinetic global wall motion. Doppler evidence of grade III (restrictive) diastolic dysfunction, elevated LAP. Moderate (Grade II) mitral regurgitation. Moderate tricuspid regurgitation. No prior study for comparison.   Stress Testing: No results found for this or any previous visit from the past 1095 days.  Heart Catheterization: None  Right and Left Heart Cath:  06/18/2020:  No coronary artery disease Normal filling pressures Compensated nonischemic cardiomyopathy  LABORATORY DATA: CBC Latest Ref Rng & Units 06/18/2020 12/15/2018 02/23/2013  WBC 4.0 - 10.5 K/uL - 7.1 9.2  Hemoglobin 12.0 - 15.0 g/dL 13.6 13.7 14.6  Hematocrit 36.0 - 46.0 % 40.0 41.2 45.3  Platelets 150 - 400 K/uL - 318 -    CMP Latest Ref Rng & Units 06/18/2020 12/15/2018 12/31/2012  Glucose 70 - 99 mg/dL - 92 82  BUN 6 - 20 mg/dL - 18 10  Creatinine 0.44 - 1.00 mg/dL - 1.03(H) 0.68  Sodium 135 - 145 mmol/L 143 141 141  Potassium 3.5 - 5.1 mmol/L 3.6 4.0 4.1  Chloride 98 - 111 mmol/L - 109 105  CO2 22 - 32 mmol/L - 22 26  Calcium 8.9 -  10.3 mg/dL - 9.5 8.9  Total Protein 6.0 - 8.3 g/dL - - 6.5  Total Bilirubin 0.3 - 1.2 mg/dL - - 0.5  Alkaline Phos 39 - 117 U/L - - 69  AST 0 - 37 U/L - - 15  ALT 0 - 35 U/L - - 14    Lipid Panel     Component Value Date/Time   CHOL 128 12/31/2012 0823   TRIG 56 12/31/2012 0823   HDL 50 12/31/2012 0823   CHOLHDL 2.6 12/31/2012 0823   VLDL 11 12/31/2012 0823   LDLCALC 67 12/31/2012 0823    No components found for: NTPROBNP No results for input(s): PROBNP in the last 8760 hours. No results for input(s): TSH in the last 8760 hours.  BMP Recent Labs    06/18/20 1355  NA 143  K 3.6    HEMOGLOBIN  A1C No results found for: HGBA1C, MPG   External Labs:  Date Collected: 05/03/2020 , information obtained by [facility] Potassium: 4.7 Creatinine 0.95 mg/dL. eGFR: 69 mL/min per 1.73 m Hemoglobin:  g/dL and hematocrit: 43.1 % Lipid profile: Total cholesterol 186 , triglycerides 66 , HDL 64 , LDL 110 AST: 22 , ALT: 32 , alkaline phosphatase: 97 BNP 691 (761 04/27/2020) TSH: 2.220  External labs: 06/03/2020: Records provided by the patient. Sodium 144, potassium 4.6, chloride 104, bicarb 22, creatinine 0.84, BUN 19, BNP 413  06/16/2020: Sodium 146, potassium 5.1, chloride 107, bicarb 23, BUN 17, creatinine 0.82, BNP 438 magnesium 2.1  IMPRESSION:    ICD-10-CM   1. Chronic combined systolic and diastolic CHF, NYHA class 3 (HCC)  I50.42 sacubitril-valsartan (ENTRESTO) 49-51 MG    Basic metabolic panel    Pro b natriuretic peptide (BNP)    Magnesium  2. Nonischemic cardiomyopathy (HCC)  I42.8 sacubitril-valsartan (ENTRESTO) 49-51 MG    Basic metabolic panel    Pro b natriuretic peptide (BNP)    Magnesium  3. History of COVID-19  Z86.16   4. LBBB (left bundle branch block)  I44.7      RECOMMENDATIONS: Marie Porter is a 60 y.o. female whose past medical history and cardiac risk factors include: Hx of COVID 79 infection (May 2021 and January 2022), chronic congestive heart failure, cardiomyopathy, history of glaucoma, hypertension, obesity due to excess calories.  Chronic combined systolic and diastolic heart failure, Stage B, NYHA class III: Clinically has dyspnea w/ exertion, weight gain, orthopnea, and mild PND.  Cath images reviewed w/ patient independently during today's visit.  Labs from 06/16/2020 independently reviewed and noted above (done at her PCP's office).  Currently has Entresto 24/53m po bid (take two tabs twice a day until she finishes her current script). Script updated in the chart for reference.  Increase Entresto to 49/566mpo bid (samples provided).   Entresto patient assistance initiated.  Recheck labs in one week to check BMP, NT-proBNP, and Mg level.  Continue lasix for now.  Medications reconciled. Plan is to max out on Entresto and than add Fargixa and Aldactone in stepwise fashion.   Newly diagnosed cardiomyopathy: See above.   History of COVID-19 infection: Patient has had Covid in May 2021 as well as January 2022. Patient is still adamant against the COVID-19 vaccination.  FINAL MEDICATION LIST END OF ENCOUNTER: Meds ordered this encounter  Medications  . sacubitril-valsartan (ENTRESTO) 49-51 MG    Sig: Take 1 tablet by mouth 2 (two) times daily.    Dispense:  180 tablet    Refill:  0  Medications Discontinued During This Encounter  Medication Reason  . sacubitril-valsartan (ENTRESTO) 24-26 MG Dose change     Current Outpatient Medications:  .  acetaminophen (TYLENOL) 500 MG tablet, Take 500 mg by mouth every 6 (six) hours as needed for moderate pain., Disp: , Rfl:  .  furosemide (LASIX) 20 MG tablet, Take 20 mg by mouth daily., Disp: , Rfl:  .  metoprolol succinate (TOPROL-XL) 25 MG 24 hr tablet, Take 1 tablet (25 mg total) by mouth daily. Take with or immediately following a meal., Disp: 90 tablet, Rfl: 0 .  omeprazole (PRILOSEC) 20 MG capsule, Take 20 mg by mouth daily as needed (heart burn)., Disp: , Rfl:  .  rosuvastatin (CRESTOR) 5 MG tablet, Take 1 tablet (5 mg total) by mouth at bedtime., Disp: 90 tablet, Rfl: 0 .  sacubitril-valsartan (ENTRESTO) 49-51 MG, Take 1 tablet by mouth 2 (two) times daily., Disp: 180 tablet, Rfl: 0  Orders Placed This Encounter  Procedures  . Basic metabolic panel  . Pro b natriuretic peptide (BNP)  . Magnesium    Patient Instructions  LAB: BMP, NT-proBNP, Magnesium level - one week later.    --Continue cardiac medications as reconciled in final medication list. --Return in about 15 days (around 07/15/2020) for Follow up, heart failure management.. Or sooner if  needed. --Continue follow-up with your primary care physician regarding the management of your other chronic comorbid conditions.  Patient's questions and concerns were addressed to her satisfaction. She voices understanding of the instructions provided during this encounter.   This note was created using a voice recognition software as a result there may be grammatical errors inadvertently enclosed that do not reflect the nature of this encounter. Every attempt is made to correct such errors.  Rex Kras, [place], [facility]  Pager: [phone] Office: [phone]

## 2020-06-30 NOTE — Telephone Encounter (Signed)
Called patient, NA, LMAM

## 2020-06-30 NOTE — Patient Instructions (Signed)
LAB: BMP, NT-proBNP, Magnesium level - one week later.

## 2020-07-02 ENCOUNTER — Other Ambulatory Visit: Payer: Self-pay

## 2020-07-02 DIAGNOSIS — I5042 Chronic combined systolic (congestive) and diastolic (congestive) heart failure: Secondary | ICD-10-CM

## 2020-07-02 DIAGNOSIS — I428 Other cardiomyopathies: Secondary | ICD-10-CM

## 2020-07-09 ENCOUNTER — Telehealth: Payer: Self-pay

## 2020-07-09 NOTE — Telephone Encounter (Signed)
Done

## 2020-07-09 NOTE — Telephone Encounter (Signed)
Noted. Continue current medications for heart failure. Keep f/u w/ST on 5/26.  Thanks MJP

## 2020-07-09 NOTE — Telephone Encounter (Signed)
Marie Porter from [place] with STAT RESULTS :   BMP: Glucose 107, sodium 146, chloride 107 NT Pro BNP: 808 Magnesium: within normal range 2.1  [phone]

## 2020-07-12 NOTE — Telephone Encounter (Signed)
Message was sent. Dr Odis Hollingshead is out of office but I have reviewed the labs. They are stable. He will discuss in detail on 5/26.  Thanks MJP

## 2020-07-12 NOTE — Telephone Encounter (Signed)
Called and spoke to pt regarding medication and pt confirmed follow up.

## 2020-07-12 NOTE — Telephone Encounter (Signed)
Called pt to inform him about message above.

## 2020-07-13 NOTE — Progress Notes (Signed)
Spoke to patient she is aware

## 2020-07-15 ENCOUNTER — Encounter: Payer: Self-pay | Admitting: Cardiology

## 2020-07-15 ENCOUNTER — Ambulatory Visit: Payer: PRIVATE HEALTH INSURANCE | Admitting: Cardiology

## 2020-07-15 ENCOUNTER — Other Ambulatory Visit: Payer: Self-pay

## 2020-07-15 VITALS — BP 115/72 | HR 79 | Resp 16 | Ht 61.0 in | Wt 196.0 lb

## 2020-07-15 DIAGNOSIS — I447 Left bundle-branch block, unspecified: Secondary | ICD-10-CM

## 2020-07-15 DIAGNOSIS — I428 Other cardiomyopathies: Secondary | ICD-10-CM

## 2020-07-15 DIAGNOSIS — I5042 Chronic combined systolic (congestive) and diastolic (congestive) heart failure: Secondary | ICD-10-CM

## 2020-07-15 DIAGNOSIS — Z8616 Personal history of COVID-19: Secondary | ICD-10-CM

## 2020-07-15 MED ORDER — ENTRESTO 97-103 MG PO TABS: 1.0000 | ORAL_TABLET | Freq: Two times a day (BID) | ORAL | 0 refills | Status: DC

## 2020-07-15 MED ORDER — DAPAGLIFLOZIN PROPANEDIOL 10 MG PO TABS: 10.0000 mg | ORAL_TABLET | Freq: Every day | ORAL | 0 refills | Status: AC

## 2020-07-15 NOTE — Progress Notes (Signed)
Date:  07/15/2020   ID:  Marie Porter, DOB 02/12/1961, MRN [id number]  PCP:  Ferd Hibbs, NP  Cardiologist: Rex Kras, DO, [facility] (established care 06/07/2020)  Date: 07/15/20 Last Office Visit: 06/30/2020  Chief Complaint  Patient presents with  . Chronic combined systolic and diastolic CHF, NYHA class 3  . Follow-up    HPI  Marie Porter is a 60 y.o. female who presents to the office with a chief complaint of " heart failure management." Patient's past medical history and cardiovascular risk factors include: Hx of COVID 19 infection (May 2021 and January 2022), chronic congestive heart failure, cardiomyopathy, history of glaucoma, hypertension, obesity due to excess calories.  Patient is a LPN at [employer] and is referred to the office by her PCP Ferd Hibbs, NP and is being followed for NICMP .  Patient is accompanied by her husband Marie Porter at today's visit.  Since last office visit patient states that her shortness of breath has improved significantly.  She is not complaining of orthopnea or paroxysmal nocturnal dyspnea.  Patient states that when she does get short of breath with effort related activities it is very short-lived and clinically feels significantly better.  Independently reviewed the most recent labs.   She denies chest pain or angina pectoris.   No family history of premature coronary disease or sudden cardiac death.  FUNCTIONAL STATUS: Walks 3-4 miles at work but no structured exercise program or daily routine.   ALLERGIES: No Known Allergies  MEDICATION LIST PRIOR TO VISIT: Current Meds  Medication Sig  . acetaminophen (TYLENOL) 500 MG tablet Take 500 mg by mouth every 6 (six) hours as needed for moderate pain.  . dapagliflozin propanediol (FARXIGA) 10 MG TABS tablet Take 1 tablet (10 mg total) by mouth daily before breakfast.  . metoprolol succinate (TOPROL-XL) 25 MG 24 hr tablet Take 1 tablet (25 mg total) by mouth  daily. Take with or immediately following a meal.  . omeprazole (PRILOSEC) 20 MG capsule Take 20 mg by mouth daily as needed (heart burn).  . rosuvastatin (CRESTOR) 5 MG tablet Take 1 tablet (5 mg total) by mouth at bedtime.  . sacubitril-valsartan (ENTRESTO) 97-103 MG Take 1 tablet by mouth 2 (two) times daily.  . [DISCONTINUED] furosemide (LASIX) 20 MG tablet Take 20 mg by mouth daily.  . [DISCONTINUED] sacubitril-valsartan (ENTRESTO) 49-51 MG Take 1 tablet by mouth 2 (two) times daily.     PAST MEDICAL HISTORY: Past Medical History:  Diagnosis Date  . Asthma   . Cardiomyopathy ([place])   . CHF (congestive heart failure) ([place])   . Glaucoma   . History of COVID-19   . Hypertension   . Migraine     PAST SURGICAL HISTORY: Past Surgical History:  Procedure Laterality Date  . EYE SURGERY     glaucoma-multiple in childhood  . GANGLION CYST EXCISION Left 10/12/2011   FOOT  . RIGHT/LEFT HEART CATH AND CORONARY ANGIOGRAPHY N/A 06/18/2020   Procedure: RIGHT/LEFT HEART CATH AND CORONARY ANGIOGRAPHY;  Surgeon: Nigel Mormon, MD;  Location: [place] [facility];  Service: Cardiovascular;  Laterality: N/A;    FAMILY HISTORY: The patient family history includes Cancer in her father; Diabetes in her maternal grandfather; Heart disease in her paternal grandmother; Hypertension in her maternal grandmother and mother; Pneumonia in her paternal grandfather; Seizures in her mother; Seizures (age of onset: 52) in her father; Stroke in her maternal grandmother and mother; Ulcers in her mother.  SOCIAL HISTORY:  The patient  reports that she has never smoked. She has never used smokeless tobacco. She reports previous alcohol use of about 1.0 standard drink of alcohol per week. She reports that she does not use drugs.  REVIEW OF SYSTEMS: Review of Systems  Constitutional: Negative for chills and fever.  HENT: Negative for hoarse voice and nosebleeds.   Eyes: Negative for discharge, double vision  and pain.  Cardiovascular: Positive for dyspnea on exertion. Negative for chest pain, claudication, leg swelling, near-syncope, orthopnea, palpitations, paroxysmal nocturnal dyspnea and syncope.  Respiratory: Negative for hemoptysis and shortness of breath.   Musculoskeletal: Negative for muscle cramps and myalgias.  Gastrointestinal: Negative for abdominal pain, constipation, diarrhea, hematemesis, hematochezia, melena, nausea and vomiting.  Neurological: Negative for dizziness and light-headedness.    PHYSICAL EXAM: Vitals with BMI 07/15/2020 06/30/2020 06/18/2020  Height '5\' 1"'  '5\' 1"'  -  Weight 196 lbs 199 lbs 13 oz -  BMI 29.47 65.46 -  Systolic 503 546 568  Diastolic 72 90 70  Pulse 79 70 73    CONSTITUTIONAL: Well-developed and well-nourished. No acute distress.  SKIN: Skin is warm and dry. No rash noted. No cyanosis. No pallor. No jaundice HEAD: Normocephalic and atraumatic.  EYES: No scleral icterus MOUTH/THROAT: Moist oral membranes.  NECK: No JVD present. No thyromegaly noted. No carotid bruits  LYMPHATIC: No visible cervical adenopathy.  CHEST Normal respiratory effort. No intercostal retractions  LUNGS: Clear to auscultation bilaterally.  No stridor. No wheezes. No rales.  CARDIOVASCULAR: Regular rate and rhythm, positive L2-X5, holosystolic murmur heard at the apex, without any gallops or rubs. ABDOMINAL: Obese, soft, nontender, nondistended, positive bowel sounds in all 4 quadrants.  No apparent ascites.  EXTREMITIES: No peripheral edema, 2+ dorsalis pedis and posterior tibial pulses bilaterally. HEMATOLOGIC: No significant bruising NEUROLOGIC: Oriented to person, place, and time. Nonfocal. Normal muscle tone.  PSYCHIATRIC: Normal mood and affect. Normal behavior. Cooperative  CARDIAC DATABASE: EKG: 12/17/2018: Normal sinus rhythm. Right superior axis deviation. Nonspecific intraventricular block. Abnormal ECG   Echocardiogram: 05/20/2020: Severely depressed LV  systolic function with visual EF <20%. Left ventricle cavity is severely dilated. Hypokinetic global wall motion. Doppler evidence of grade III (restrictive) diastolic dysfunction, elevated LAP. Moderate (Grade II) mitral regurgitation. Moderate tricuspid regurgitation. No prior study for comparison.   Stress Testing: No results found for this or any previous visit from the past 1095 days.  Heart Catheterization: None  Right and Left Heart Cath:  06/18/2020:  No coronary artery disease Normal filling pressures Compensated nonischemic cardiomyopathy  LABORATORY DATA: CBC Latest Ref Rng & Units 06/18/2020 12/15/2018 02/23/2013  WBC 4.0 - 10.5 K/uL - 7.1 9.2  Hemoglobin 12.0 - 15.0 g/dL 13.6 13.7 14.6  Hematocrit 36.0 - 46.0 % 40.0 41.2 45.3  Platelets 150 - 400 K/uL - 318 -    CMP Latest Ref Rng & Units 06/18/2020 12/15/2018 12/31/2012  Glucose 70 - 99 mg/dL - 92 82  BUN 6 - 20 mg/dL - 18 10  Creatinine 0.44 - 1.00 mg/dL - 1.03(H) 0.68  Sodium 135 - 145 mmol/L 143 141 141  Potassium 3.5 - 5.1 mmol/L 3.6 4.0 4.1  Chloride 98 - 111 mmol/L - 109 105  CO2 22 - 32 mmol/L - 22 26  Calcium 8.9 - 10.3 mg/dL - 9.5 8.9  Total Protein 6.0 - 8.3 g/dL - - 6.5  Total Bilirubin 0.3 - 1.2 mg/dL - - 0.5  Alkaline Phos 39 - 117 U/L - - 69  AST 0 - 37 U/L - -  15  ALT 0 - 35 U/L - - 14    Lipid Panel     Component Value Date/Time   CHOL 128 12/31/2012 0823   TRIG 56 12/31/2012 0823   HDL 50 12/31/2012 0823   CHOLHDL 2.6 12/31/2012 0823   VLDL 11 12/31/2012 0823   LDLCALC 67 12/31/2012 0823    No components found for: NTPROBNP No results for input(s): PROBNP in the last 8760 hours. No results for input(s): TSH in the last 8760 hours.  BMP Recent Labs    06/18/20 1355  NA 143  K 3.6    HEMOGLOBIN A1C No results found for: HGBA1C, MPG   External Labs:  Date Collected: 05/03/2020 , information obtained by [facility] Potassium: 4.7 Creatinine 0.95 mg/dL. eGFR: 69 mL/min per 1.73  m Hematocrit: 43.1 % Lipid profile: Total cholesterol 186 , triglycerides 66 , HDL 64 , LDL 110 AST: 22 , ALT: 32 , alkaline phosphatase: 97 BNP 691 (761 04/27/2020) TSH: 2.220  External labs: 06/03/2020: Records provided by the patient. Sodium 144, potassium 4.6, chloride 104, bicarb 22, creatinine 0.84, BUN 19, BNP 413  06/16/2020: Sodium 146, potassium 5.1, chloride 107, bicarb 23, BUN 17, creatinine 0.82, BNP 438 magnesium 2.1  IMPRESSION:    ICD-10-CM   1. Chronic combined systolic and diastolic CHF, NYHA class 3 (HCC)  I50.42 sacubitril-valsartan (ENTRESTO) 97-103 MG    dapagliflozin propanediol (FARXIGA) 10 MG TABS tablet    Ambulatory referral to Neurology  2. Nonischemic cardiomyopathy (HCC)  I42.8 sacubitril-valsartan (ENTRESTO) 97-103 MG    dapagliflozin propanediol (FARXIGA) 10 MG TABS tablet  3. History of COVID-19  Z86.16   4. LBBB (left bundle branch block)  I44.7      RECOMMENDATIONS: TANAYA DUNIGAN is a 60 y.o. female whose past medical history and cardiac risk factors include: Hx of COVID 20 infection (May 2021 and January 2022), chronic congestive heart failure, cardiomyopathy, history of glaucoma, hypertension, obesity due to excess calories.  Chronic combined systolic and diastolic heart failure, Stage B, NYHA class II/III: Improving. Echocardiogram images reviewed with the patient's husband as per their request. Most recent labs dependently reviewed. Patient currently has Entresto 49/51 mg tablets.  She is asked to take 2 tablets in the morning and 2 tablets in the evening for 1 week and to recheck her blood work.  Patient gets her blood work checked at her work and it is cost effective.  She is asked to check NT proBNP, BMP, and magnesium levels and have the labs sent to the office for further evaluation. I will also send in a prescription for Farxiga to see if it is approved and her current health plan and so the patient is aware of the cost.  If the repeat  blood work on the higher dose of Entresto stable she is asked to stop Lasix and to start [place]. Reviewed her logs of blood pressure and weight.  Patient has lost approximately 3 to 4 pounds since last office visit. Will continue to uptitrate GDMT.  Newly diagnosed cardiomyopathy: See above.  Patient understands that she will need a repeat echocardiogram in 90 days once her medications have been uptitrated to maximum tolerated doses.  If her LVEF is severely reduced will consider EP evaluation for possible AICD implantation for primary prevention purposes.  History of COVID-19 infection: Patient has had Covid in May 2021 as well as January 2022. Patient is still adamant against the COVID-19 vaccination.  FINAL MEDICATION LIST END OF ENCOUNTER: Meds ordered this  encounter  Medications  . sacubitril-valsartan (ENTRESTO) 97-103 MG    Sig: Take 1 tablet by mouth 2 (two) times daily.    Dispense:  180 tablet    Refill:  0  . dapagliflozin propanediol (FARXIGA) 10 MG TABS tablet    Sig: Take 1 tablet (10 mg total) by mouth daily before breakfast.    Dispense:  90 tablet    Refill:  0    Medications Discontinued During This Encounter  Medication Reason  . sacubitril-valsartan (ENTRESTO) 49-51 MG Dose change  . furosemide (LASIX) 20 MG tablet Change in therapy     Current Outpatient Medications:  .  acetaminophen (TYLENOL) 500 MG tablet, Take 500 mg by mouth every 6 (six) hours as needed for moderate pain., Disp: , Rfl:  .  dapagliflozin propanediol (FARXIGA) 10 MG TABS tablet, Take 1 tablet (10 mg total) by mouth daily before breakfast., Disp: 90 tablet, Rfl: 0 .  metoprolol succinate (TOPROL-XL) 25 MG 24 hr tablet, Take 1 tablet (25 mg total) by mouth daily. Take with or immediately following a meal., Disp: 90 tablet, Rfl: 0 .  omeprazole (PRILOSEC) 20 MG capsule, Take 20 mg by mouth daily as needed (heart burn)., Disp: , Rfl:  .  rosuvastatin (CRESTOR) 5 MG tablet, Take 1 tablet (5 mg  total) by mouth at bedtime., Disp: 90 tablet, Rfl: 0 .  sacubitril-valsartan (ENTRESTO) 97-103 MG, Take 1 tablet by mouth 2 (two) times daily., Disp: 180 tablet, Rfl: 0  Orders Placed This Encounter  Procedures  . Ambulatory referral to Neurology    There are no Patient Instructions on file for this visit.  --Continue cardiac medications as reconciled in final medication list. --Return in about 3 weeks (around 08/05/2020) for Follow up, heart failure management.. Or sooner if needed. --Continue follow-up with your primary care physician regarding the management of your other chronic comorbid conditions.  Patient's questions and concerns were addressed to her satisfaction. She voices understanding of the instructions provided during this encounter.   This note was created using a voice recognition software as a result there may be grammatical errors inadvertently enclosed that do not reflect the nature of this encounter. Every attempt is made to correct such errors.  Rex Kras, [place], [facility]  Pager: [phone] Office: [phone]

## 2020-07-20 ENCOUNTER — Telehealth: Payer: Self-pay

## 2020-07-20 NOTE — Telephone Encounter (Signed)
Pt called having chest pain and says she has gained 3 pounds. Chest pain started this morning around 8, but  isn't severe no other symptoms.

## 2020-07-21 NOTE — Telephone Encounter (Signed)
Patient states that her weight is 196 pounds which is the same as it was last week. Continue the current GDMT. 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 If the kidney function and electrolytes are within normal limits patient will be asked to stop Lasix and to start [place].  I will reach out to her once the labs are available. Chest pain/discomfort appears to be noncardiac.  She recently had a cath without any significant obstructive CAD.

## 2020-07-23 ENCOUNTER — Telehealth: Payer: Self-pay | Admitting: Cardiology

## 2020-07-23 NOTE — Telephone Encounter (Signed)
External Labs: Collected: 07/22/2020 performed at her PCPs office. Creatinine 0.81 mg/dL. eGFR: 84 mL/min per 1.73 m NT proBNP 1495 Sodium 144 potassium 4.9, chloride 106, bicarb 22  Patient's last NT proBNP 808.  Today's weight 197.6 pounds.  Shortness of breath and dyspnea w/ effort (stable).  No LE swelling and 2 pillow orthopnea.   Start Farxiga 43m po qday.  Labs in one week to recheck renal, electrolyte, NT Pro-BNP.  May use Lasix 229mpo qday prn for weight gain.   SuRex KrasDONevadaFASoutheast Missouri Mental Health CenterPager: [phone]: [phone]

## 2020-07-30 IMAGING — MG DIGITAL SCREENING BILAT W/ TOMO W/ CAD
8 series · 8 of 24 positions shown · non-contrast
Comparison: Previous exam(s).

CLINICAL DATA: Screening.

EXAM:
DIGITAL SCREENING BILATERAL MAMMOGRAM WITH TOMO AND CAD

[R CC synth-2D]
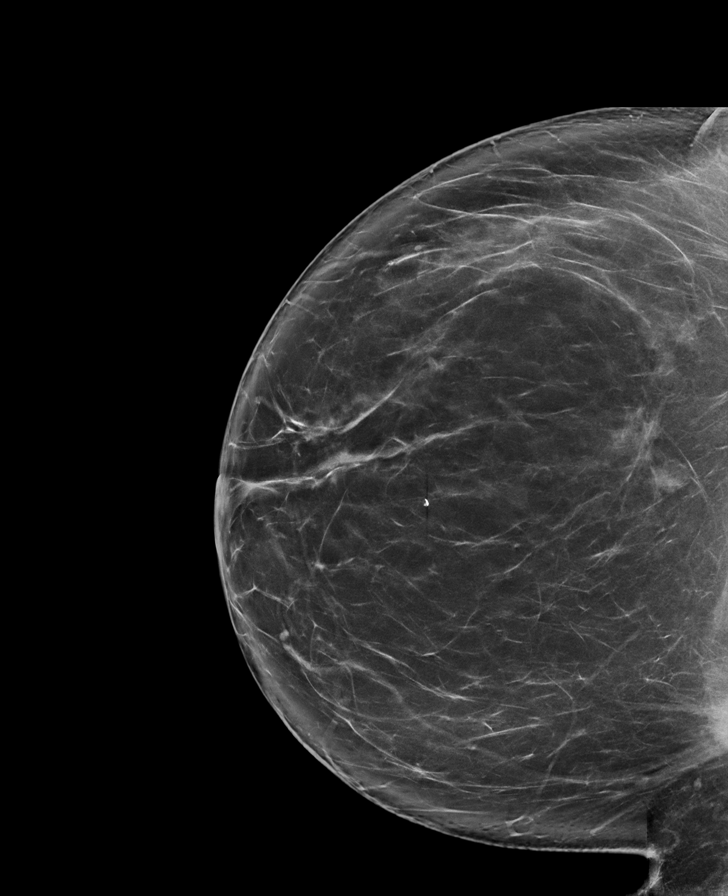

[L MLO synth-2D]
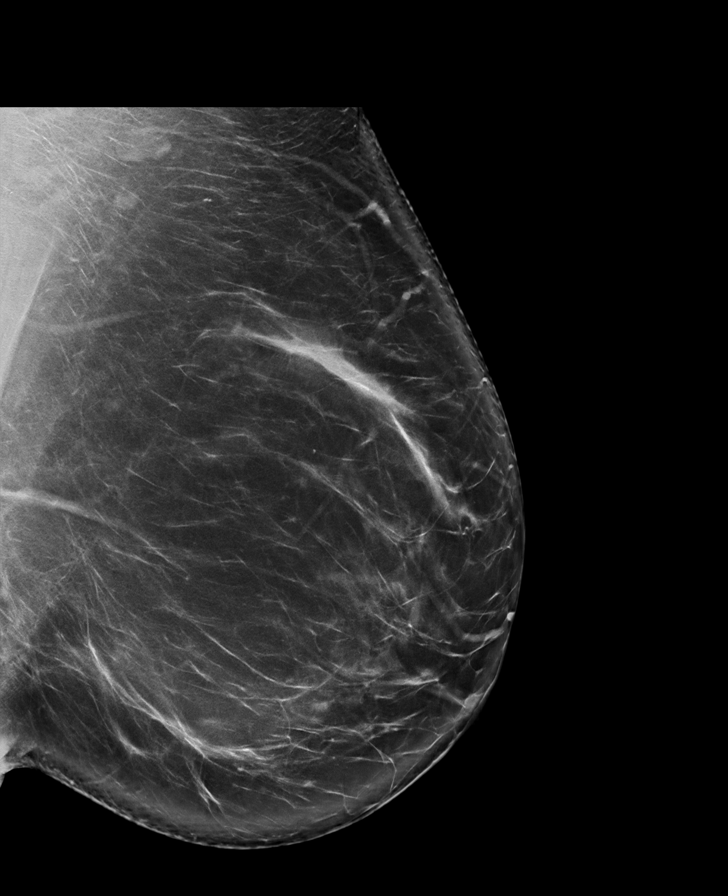

[R MLO synth-2D]
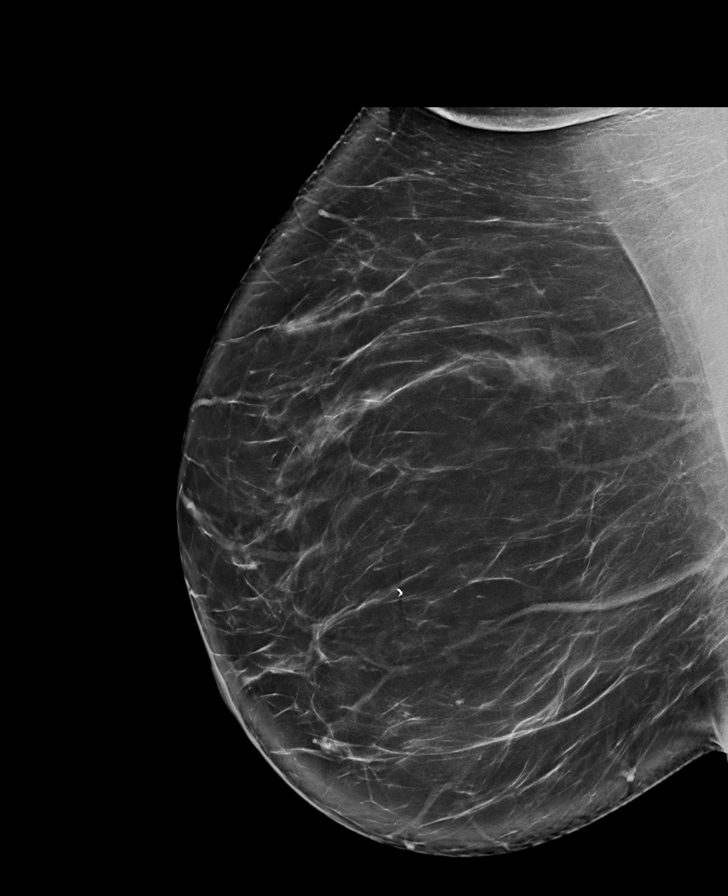

[L CC synth-2D]
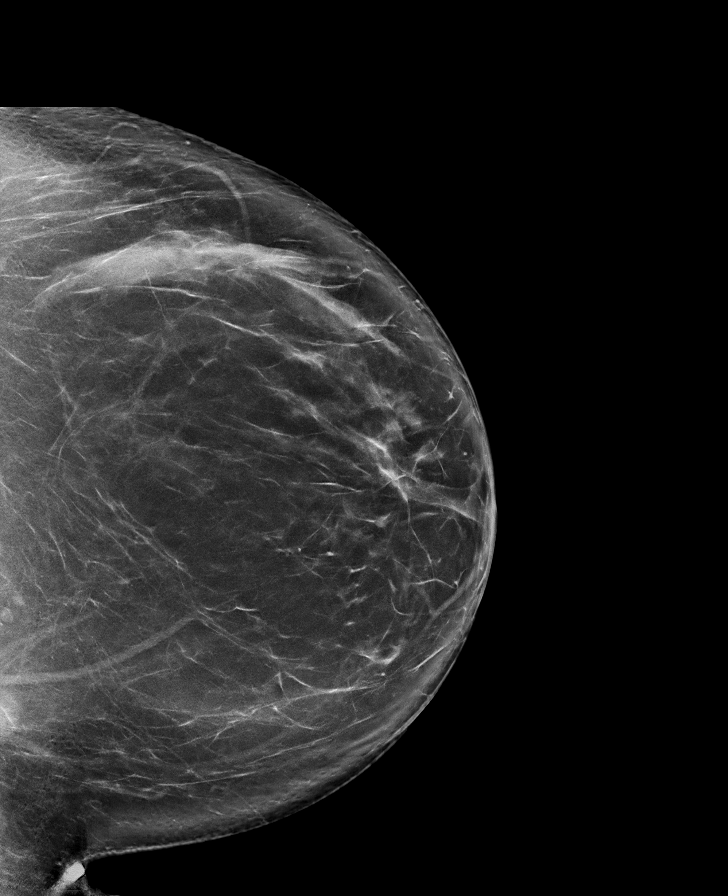

[L MLO tomo · tomo slice 55/108.0]
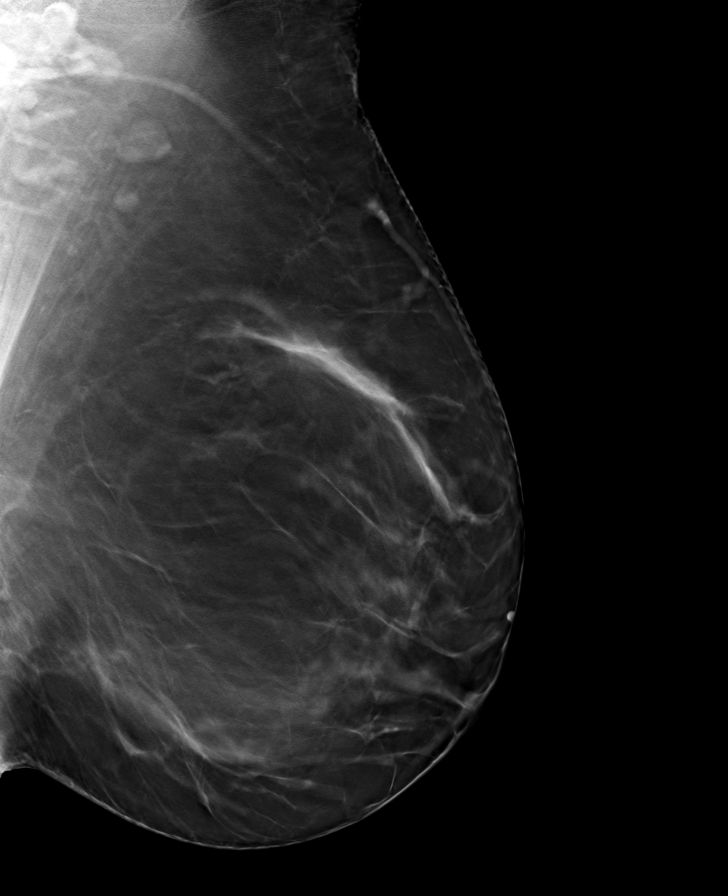

[R MLO tomo · tomo slice 53/105.0]
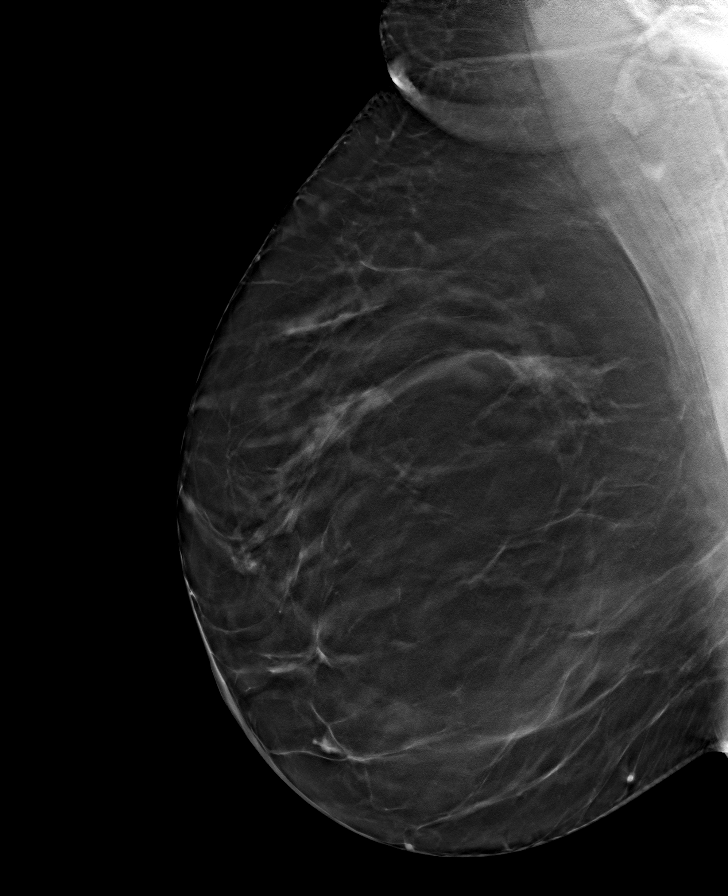

[L CC tomo · tomo slice 53/104.0]
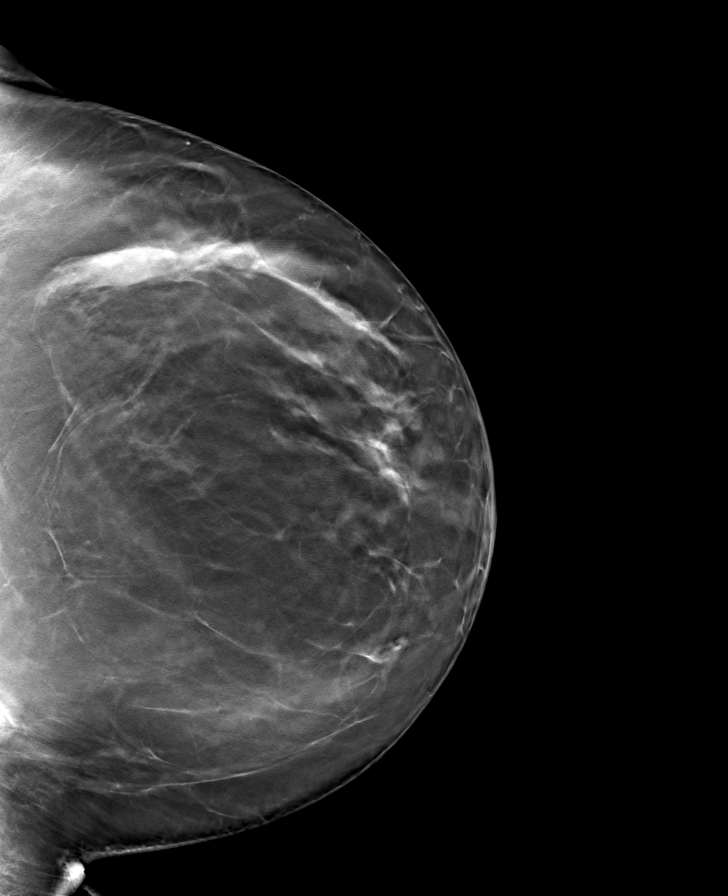

[R CC tomo · tomo slice 51/101.0]
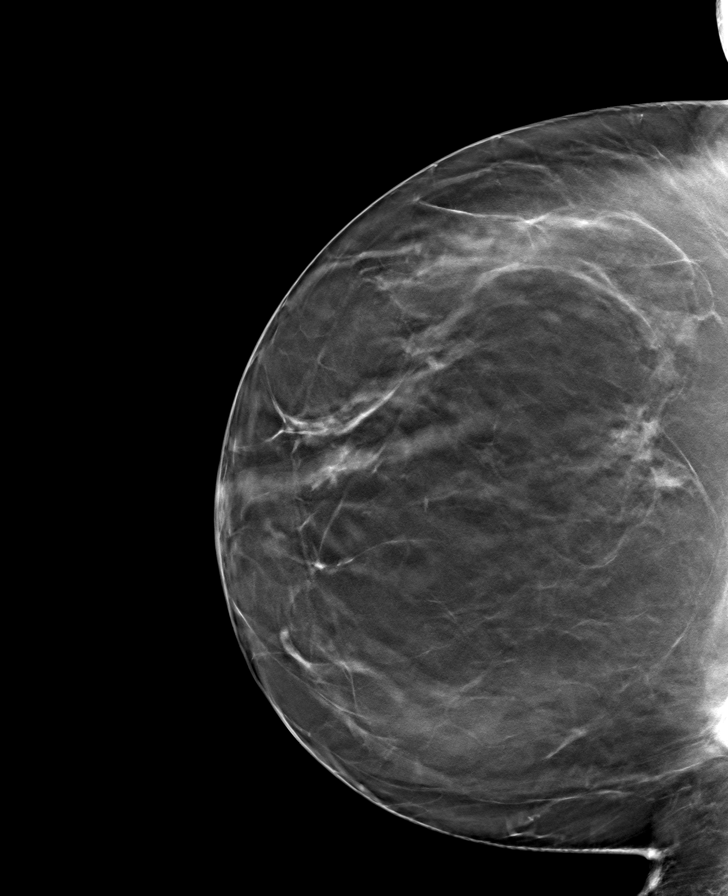

[8 of 24 positions shown; findings below may reference images not displayed]

ACR Breast Density Category b: There are scattered areas of
fibroglandular density.
FINDINGS: There are no findings suspicious for malignancy. Images were
processed with CAD.
IMPRESSION: No mammographic evidence of malignancy. A result letter of this
screening mammogram will be mailed directly to the patient.

RECOMMENDATION:
Screening mammogram in one year. (Code:[id number])

BI-RADS CATEGORY  1: Negative.

## 2020-08-12 ENCOUNTER — Other Ambulatory Visit: Payer: Self-pay

## 2020-08-12 ENCOUNTER — Ambulatory Visit: Payer: PRIVATE HEALTH INSURANCE | Admitting: Cardiology

## 2020-08-12 ENCOUNTER — Encounter: Payer: Self-pay | Admitting: Cardiology

## 2020-08-12 VITALS — BP 119/70 | HR 73 | Resp 16 | Ht 61.0 in | Wt 198.0 lb

## 2020-08-12 DIAGNOSIS — I447 Left bundle-branch block, unspecified: Secondary | ICD-10-CM

## 2020-08-12 DIAGNOSIS — I5042 Chronic combined systolic (congestive) and diastolic (congestive) heart failure: Secondary | ICD-10-CM

## 2020-08-12 DIAGNOSIS — I428 Other cardiomyopathies: Secondary | ICD-10-CM

## 2020-08-12 DIAGNOSIS — Z8616 Personal history of COVID-19: Secondary | ICD-10-CM

## 2020-08-12 MED ORDER — SPIRONOLACTONE 25 MG PO TABS
25.0000 mg | ORAL_TABLET | Freq: Every day | ORAL | 0 refills | Status: DC
Start: 2020-08-12 — End: 2020-11-10

## 2020-08-12 MED ORDER — ENTRESTO 97-103 MG PO TABS: 1.0000 | ORAL_TABLET | Freq: Two times a day (BID) | ORAL | 3 refills | Status: AC

## 2020-08-12 MED ORDER — METOPROLOL SUCCINATE ER 50 MG PO TB24: 50.0000 mg | ORAL_TABLET | Freq: Every morning | ORAL | 0 refills | Status: DC

## 2020-08-12 NOTE — Progress Notes (Signed)
Date:  08/12/2020   ID:  Marie Porter, DOB [birth year], MRN [id number]  PCP:  Ferd Hibbs, NP  Cardiologist: Rex Kras, DO, [facility] (established care 06/07/2020)  Date: 08/12/20 Last Office Visit: 07/15/2020  Chief Complaint  Patient presents with   Follow-up   Congestive Heart Failure    HPI  Marie Porter is a 60 y.o. female who presents to the office with a chief complaint of " heart failure management." Patient's past medical history and cardiovascular risk factors include: Hx of COVID 19 infection (May 2021 and January 2022), chronic congestive heart failure, cardiomyopathy, history of glaucoma, hypertension, obesity due to excess calories.  Patient is a LPN at [employer] and is referred to the office by her PCP Ferd Hibbs, NP and is being followed for NICMP .  Since last visit she has been having less orthopnea but continues to feel that "she is walking in concrete."   Since last visit she is now on max doses of Entresto and Fargixa. Labs from 07/29/2020 independently reviewed.   Weight remains stable.   No ER or urgent care visit for cardiac symptoms.   FUNCTIONAL STATUS: Walks 3-4 miles at work but no structured exercise program or daily routine.   ALLERGIES: No Known Allergies  MEDICATION LIST PRIOR TO VISIT: Current Meds  Medication Sig   acetaminophen (TYLENOL) 500 MG tablet Take 500 mg by mouth every 6 (six) hours as needed for moderate pain.   dapagliflozin propanediol (FARXIGA) 10 MG TABS tablet Take 1 tablet (10 mg total) by mouth daily before breakfast.   metoprolol succinate (TOPROL-XL) 50 MG 24 hr tablet Take 1 tablet (50 mg total) by mouth every morning. Take with or immediately following a meal.   omeprazole (PRILOSEC) 20 MG capsule Take 20 mg by mouth daily as needed (heart burn).   rosuvastatin (CRESTOR) 5 MG tablet Take 1 tablet (5 mg total) by mouth at bedtime.   sacubitril-valsartan (ENTRESTO) 97-103 MG Take 1  tablet by mouth 2 (two) times daily.   spironolactone (ALDACTONE) 25 MG tablet Take 1 tablet (25 mg total) by mouth daily.   [DISCONTINUED] metoprolol succinate (TOPROL-XL) 25 MG 24 hr tablet Take 1 tablet (25 mg total) by mouth daily. Take with or immediately following a meal.     PAST MEDICAL HISTORY: Past Medical History:  Diagnosis Date   Asthma    Cardiomyopathy ([place])    CHF (congestive heart failure) ([place])    Glaucoma    History of COVID-19    Hypertension    Migraine     PAST SURGICAL HISTORY: Past Surgical History:  Procedure Laterality Date   EYE SURGERY     glaucoma-multiple in childhood   GANGLION CYST EXCISION Left 10/12/2011   FOOT   RIGHT/LEFT HEART CATH AND CORONARY ANGIOGRAPHY N/A 06/18/2020   Procedure: RIGHT/LEFT HEART CATH AND CORONARY ANGIOGRAPHY;  Surgeon: Nigel Mormon, MD;  Location: [facility];  Service: Cardiovascular;  Laterality: N/A;    FAMILY HISTORY: The patient family history includes Cancer in her father; Diabetes in her maternal grandfather; Heart disease in her paternal grandmother; Hypertension in her maternal grandmother and mother; Pneumonia in her paternal grandfather; Seizures in her mother; Seizures (age of onset: 72) in her father; Stroke in her maternal grandmother and mother; Ulcers in her mother.  SOCIAL HISTORY:  The patient  reports that she has never smoked. She has never used smokeless tobacco. She reports previous alcohol use of about 1.0 standard drink  of alcohol per week. She reports that she does not use drugs.  REVIEW OF SYSTEMS: Review of Systems  Constitutional: Negative for chills and fever.  HENT:  Negative for hoarse voice and nosebleeds.   Eyes:  Negative for discharge, double vision and pain.  Cardiovascular:  Positive for dyspnea on exertion. Negative for chest pain, claudication, leg swelling, near-syncope, orthopnea, palpitations, paroxysmal nocturnal dyspnea and syncope.  Respiratory:  Negative for  hemoptysis and shortness of breath.   Musculoskeletal:  Negative for muscle cramps and myalgias.  Gastrointestinal:  Negative for abdominal pain, constipation, diarrhea, hematemesis, hematochezia, melena, nausea and vomiting.  Neurological:  Negative for dizziness and light-headedness.   PHYSICAL EXAM: Vitals with BMI 08/12/2020 07/15/2020 06/30/2020  Height '5\' 1"'  '5\' 1"'  '5\' 1"'   Weight 198 lbs 196 lbs 199 lbs 13 oz  BMI 37.43 96.75 91.63  Systolic 846 659 935  Diastolic 70 72 90  Pulse 73 79 70    CONSTITUTIONAL: Well-developed and well-nourished. No acute distress.  SKIN: Skin is warm and dry. No rash noted. No cyanosis. No pallor. No jaundice HEAD: Normocephalic and atraumatic.  EYES: No scleral icterus MOUTH/THROAT: Moist oral membranes.  NECK: No JVD present. No thyromegaly noted. No carotid bruits  LYMPHATIC: No visible cervical adenopathy.  CHEST Normal respiratory effort. No intercostal retractions  LUNGS: Clear to auscultation bilaterally.  No stridor. No wheezes. No rales.  CARDIOVASCULAR: Regular rate and rhythm, positive T0-V7, holosystolic murmur heard at the apex, without any gallops or rubs. ABDOMINAL: Obese, soft, nontender, nondistended, positive bowel sounds in all 4 quadrants.  No apparent ascites.  EXTREMITIES: No peripheral edema, 2+ dorsalis pedis and posterior tibial pulses bilaterally. HEMATOLOGIC: No significant bruising NEUROLOGIC: Oriented to person, place, and time. Nonfocal. Normal muscle tone.  PSYCHIATRIC: Normal mood and affect. Normal behavior. Cooperative  CARDIAC DATABASE: EKG: 12/17/2018: Normal sinus rhythm. Right superior axis deviation. Nonspecific intraventricular block. Abnormal ECG   Echocardiogram: 05/20/2020: Severely depressed LV systolic function with visual EF <20%. Left ventricle cavity is severely dilated. Hypokinetic global wall motion. Doppler evidence of grade III (restrictive) diastolic dysfunction, elevated LAP. Moderate (Grade II)  mitral regurgitation. Moderate tricuspid regurgitation. No prior study for comparison.   Stress Testing: No results found for this or any previous visit from the past 1095 days.  Heart Catheterization: None  Right and Left Heart Cath:  06/18/2020:  No coronary artery disease Normal filling pressures Compensated nonischemic cardiomyopathy  LABORATORY DATA: CBC Latest Ref Rng & Units 06/18/2020 12/15/2018 02/23/2013  WBC 4.0 - 10.5 K/uL - 7.1 9.2  Hemoglobin 12.0 - 15.0 g/dL 13.6 13.7 14.6  Hematocrit 36.0 - 46.0 % 40.0 41.2 45.3  Platelets 150 - 400 K/uL - 318 -    CMP Latest Ref Rng & Units 06/18/2020 12/15/2018 12/31/2012  Glucose 70 - 99 mg/dL - 92 82  BUN 6 - 20 mg/dL - 18 10  Creatinine 0.44 - 1.00 mg/dL - 1.03(H) 0.68  Sodium 135 - 145 mmol/L 143 141 141  Potassium 3.5 - 5.1 mmol/L 3.6 4.0 4.1  Chloride 98 - 111 mmol/L - 109 105  CO2 22 - 32 mmol/L - 22 26  Calcium 8.9 - 10.3 mg/dL - 9.5 8.9  Total Protein 6.0 - 8.3 g/dL - - 6.5  Total Bilirubin 0.3 - 1.2 mg/dL - - 0.5  Alkaline Phos 39 - 117 U/L - - 69  AST 0 - 37 U/L - - 15  ALT 0 - 35 U/L - - 14    Lipid Panel  Component Value Date/Time   CHOL 128 12/31/2012 0823   TRIG 56 12/31/2012 0823   HDL 50 12/31/2012 0823   CHOLHDL 2.6 12/31/2012 0823   VLDL 11 12/31/2012 0823   LDLCALC 67 12/31/2012 0823    No components found for: NTPROBNP No results for input(s): PROBNP in the last 8760 hours. No results for input(s): TSH in the last 8760 hours.  BMP Recent Labs    06/18/20 1355  NA 143  K 3.6    HEMOGLOBIN A1C No results found for: HGBA1C, MPG   External Labs:  Date Collected: 05/03/2020 , information obtained by [facility] Potassium: 4.7 Creatinine 0.95 mg/dL. eGFR: 69 mL/min per 1.73 m Hematocrit: 43.1 % Lipid profile: Total cholesterol 186 , triglycerides 66 , HDL 64 , LDL 110 AST: 22 , ALT: 32 , alkaline phosphatase: 97 BNP 691 (761 04/27/2020) TSH: 2.220  External  labs: 06/03/2020: Records provided by the patient. Sodium 144, potassium 4.6, chloride 104, bicarb 22, creatinine 0.84, BUN 19, BNP 413  06/16/2020: Sodium 146, potassium 5.1, chloride 107, bicarb 23, BUN 17, creatinine 0.82, BNP 438 magnesium 2.1  External Labs: Collected: 07/22/2020 performed at her PCPs office. Creatinine 0.81 mg/dL. eGFR: 84 mL/min per 1.73 m NT proBNP 1495 Sodium 144 potassium 4.9, chloride 106, bicarb 22  Collected: 07/29/2020. Sodium 141, potassium 4.7, chloride 110, bicarb 17, BUN 16, creatinine 0.78, NT proBNP 1118. Magnesium 2.1  IMPRESSION:    ICD-10-CM   1. Chronic combined systolic and diastolic CHF, NYHA class 3 (HCC)  I50.42 metoprolol succinate (TOPROL-XL) 50 MG 24 hr tablet    spironolactone (ALDACTONE) 25 MG tablet    2. Nonischemic cardiomyopathy (HCC)  I42.8 metoprolol succinate (TOPROL-XL) 50 MG 24 hr tablet    spironolactone (ALDACTONE) 25 MG tablet    3. History of COVID-19  Z86.16     4. LBBB (left bundle branch block)  I44.7        RECOMMENDATIONS: Marie Porter is a 60 y.o. female whose past medical history and cardiac risk factors include: Hx of COVID 81 infection (May 2021 and January 2022), chronic congestive heart failure, cardiomyopathy, history of glaucoma, hypertension, obesity due to excess calories.  Chronic combined systolic and diastolic heart failure, Stage B, NYHA class II/III: Improving. Most recent labs dependently reviewed. Now on maximum doses of Entresto and Fargixa.  Increase Toprol XL to 38m po qAM Add aldactone 241mpo qday.  Labs in one week to check BMP, Nt-proBNP, and Mg (patient gets them done at PCP office).  Continues to be short of breath with effort related activities but less orthopnea at night.  She claims that she does not want to be on medical therapy long-term and still feel short of breath.  Had a long discussion with her regarding the need to uptitrate GDMT and follow up echocardiogram to  re-evaluate LVEF and if still severely reduced consider BiV ICD given there underlying LBBB.  I have also offered her to seek second opinion or seeing advance heart failure specialist in consultation. She would like to hold off at the current time.  PA for Entrestro filled at today's visit.   Newly diagnosed cardiomyopathy: See above.  Will need a repeat echocardiogram in 90 days once her medications have been uptitrated to maximum tolerated doses.  If her LVEF is severely reduced will consider EP evaluation for possible BiV ICD implantation.   History of COVID-19 infection: Patient has had Covid in May 2021 as well as January 2022. Patient is still adamant  against the COVID-19 vaccination.  FINAL MEDICATION LIST END OF ENCOUNTER: Meds ordered this encounter  Medications   metoprolol succinate (TOPROL-XL) 50 MG 24 hr tablet    Sig: Take 1 tablet (50 mg total) by mouth every morning. Take with or immediately following a meal.    Dispense:  90 tablet    Refill:  0   spironolactone (ALDACTONE) 25 MG tablet    Sig: Take 1 tablet (25 mg total) by mouth daily.    Dispense:  90 tablet    Refill:  0    Medications Discontinued During This Encounter  Medication Reason   metoprolol succinate (TOPROL-XL) 25 MG 24 hr tablet Dose change     Current Outpatient Medications:    acetaminophen (TYLENOL) 500 MG tablet, Take 500 mg by mouth every 6 (six) hours as needed for moderate pain., Disp: , Rfl:    dapagliflozin propanediol (FARXIGA) 10 MG TABS tablet, Take 1 tablet (10 mg total) by mouth daily before breakfast., Disp: 90 tablet, Rfl: 0   metoprolol succinate (TOPROL-XL) 50 MG 24 hr tablet, Take 1 tablet (50 mg total) by mouth every morning. Take with or immediately following a meal., Disp: 90 tablet, Rfl: 0   omeprazole (PRILOSEC) 20 MG capsule, Take 20 mg by mouth daily as needed (heart burn)., Disp: , Rfl:    rosuvastatin (CRESTOR) 5 MG tablet, Take 1 tablet (5 mg total) by mouth at bedtime.,  Disp: 90 tablet, Rfl: 0   sacubitril-valsartan (ENTRESTO) 97-103 MG, Take 1 tablet by mouth 2 (two) times daily., Disp: 180 tablet, Rfl: 0   spironolactone (ALDACTONE) 25 MG tablet, Take 1 tablet (25 mg total) by mouth daily., Disp: 90 tablet, Rfl: 0  No orders of the defined types were placed in this encounter.   There are no Patient Instructions on file for this visit.  --Continue cardiac medications as reconciled in final medication list. --Return in about 3 months (around 11/12/2020) for Follow up, heart failure management.. Or sooner if needed. --Continue follow-up with your primary care physician regarding the management of your other chronic comorbid conditions.  Patient's questions and concerns were addressed to her satisfaction. She voices understanding of the instructions provided during this encounter.   This note was created using a voice recognition software as a result there may be grammatical errors inadvertently enclosed that do not reflect the nature of this encounter. Every attempt is made to correct such errors.  Rex Kras, [place], [facility]  Pager: [phone] Office: [phone]

## 2020-08-24 ENCOUNTER — Telehealth: Payer: Self-pay

## 2020-08-24 NOTE — Telephone Encounter (Signed)
Pt was approved for [facility] Patient Assistance

## 2020-08-25 ENCOUNTER — Other Ambulatory Visit: Payer: Self-pay | Admitting: Cardiology

## 2020-08-25 DIAGNOSIS — I5042 Chronic combined systolic (congestive) and diastolic (congestive) heart failure: Secondary | ICD-10-CM

## 2020-08-25 DIAGNOSIS — I429 Cardiomyopathy, unspecified: Secondary | ICD-10-CM

## 2020-09-07 ENCOUNTER — Telehealth: Payer: Self-pay

## 2020-09-07 NOTE — Telephone Encounter (Signed)
Pt calling for lab results and also saying she is having a lot of heart burn, omeprazole is not helping

## 2020-09-10 ENCOUNTER — Telehealth: Payer: Self-pay | Admitting: Cardiology

## 2020-09-10 DIAGNOSIS — I5042 Chronic combined systolic (congestive) and diastolic (congestive) heart failure: Secondary | ICD-10-CM

## 2020-09-10 DIAGNOSIS — I428 Other cardiomyopathies: Secondary | ICD-10-CM

## 2020-09-10 DIAGNOSIS — I447 Left bundle-branch block, unspecified: Secondary | ICD-10-CM

## 2020-09-10 NOTE — Addendum Note (Signed)
Addended by: Durward Mallard on: 09/10/2020 05:37 PM   Modules accepted: Level of Service

## 2020-09-10 NOTE — Telephone Encounter (Signed)
ON-CALL CARDIOLOGY 09/10/20  Patient's name: Marie Porter.   MRN: [id number].    DOB: 1960/09/14 Primary care provider: Lance Bosch, NP. Primary cardiologist: Tessa Lerner, DO, [facility]  Interaction regarding this patient's care today: Reviewed the most recent labs from 08/26/2020 that were done at outside facility.  Patient is aware that she has mild hyperkalemia I recommended that she have repeat blood work done for reevaluation.  Patient is tolerating GDMT given her nonischemic cardiomyopathy.  I will order an echocardiogram to reevaluate her LVEF and she is recommended to follow-up with cardioactive physiology in consultation for either CRT-P versus CRT-D given her LVEF on repeat echocardiogram.  She also has an appointment with sleep medicine on September 20, 2020.  Patient is agreeable with the plan of care.  Impression:   ICD-10-CM   1. Chronic combined systolic and diastolic CHF, NYHA class 3 (HCC)  I50.42 PCV ECHOCARDIOGRAM COMPLETE    Ambulatory referral to Cardiac Electrophysiology    2. Nonischemic cardiomyopathy (HCC)  I42.8 PCV ECHOCARDIOGRAM COMPLETE    Ambulatory referral to Cardiac Electrophysiology    3. LBBB (left bundle branch block)  I44.7 PCV ECHOCARDIOGRAM COMPLETE    Ambulatory referral to Cardiac Electrophysiology      No orders of the defined types were placed in this encounter.   Orders Placed This Encounter  Procedures   Ambulatory referral to Cardiac Electrophysiology   PCV ECHOCARDIOGRAM COMPLETE    Recommendations: Continue current GDMT. Repeat echo prior to her office visit with cardiac electrophysiology to determine if she is a candidate for CRT-P versus CRT-D.  Telephone encounter total time: 17 minutes   Delilah Shan [facility]  Pager: [phone] Office: [phone]

## 2020-09-14 NOTE — Telephone Encounter (Signed)
Please call the patient she is suppose to get labs done and faxed for reference.  Please talk to Bev G I had placed a consult for EP as well.

## 2020-09-15 NOTE — Telephone Encounter (Signed)
Spoke to patient she stated she spoke to ST  Friday and she is aware she needs labs she will get them done through her job and will send [place] her results

## 2020-09-20 ENCOUNTER — Encounter: Payer: Self-pay | Admitting: Neurology

## 2020-09-20 ENCOUNTER — Ambulatory Visit: Payer: Self-pay | Admitting: Neurology

## 2020-09-20 VITALS — BP 116/70 | HR 60 | Ht 61.5 in | Wt 205.0 lb

## 2020-09-20 DIAGNOSIS — I471 Supraventricular tachycardia, unspecified: Secondary | ICD-10-CM

## 2020-09-20 DIAGNOSIS — I5040 Unspecified combined systolic (congestive) and diastolic (congestive) heart failure: Secondary | ICD-10-CM

## 2020-09-20 DIAGNOSIS — I5022 Chronic systolic (congestive) heart failure: Secondary | ICD-10-CM

## 2020-09-20 DIAGNOSIS — I5189 Other ill-defined heart diseases: Secondary | ICD-10-CM

## 2020-09-20 DIAGNOSIS — I420 Dilated cardiomyopathy: Secondary | ICD-10-CM

## 2020-09-20 DIAGNOSIS — G478 Other sleep disorders: Secondary | ICD-10-CM | POA: Insufficient documentation

## 2020-09-20 NOTE — Progress Notes (Signed)
SLEEP MEDICINE CLINIC    Provider:  Melvyn Novas, MD  Primary Care Physician:  Lance Bosch, NP [address] [place] [address]     Referring Provider:  Dr. Odis Hollingshead.        Chief Complaint according to patient   Patient presents with:     New Patient (Initial Visit)      Internal referral for sleep apnea evaluation. Pt c/o of bad dreams 3-4x a week, making it hard for her to fall back asleep. Reports no snoring or gasping for air. Pt sleeps for 1-2 hrs and is up and unable to fall asleep. Pt has hotflashes in the middle of nights.       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  She has CHF and follows with Dr. Odis Hollingshead, DO for a sleep study.   Chief concern according to patient :   EF reduced to 15-20% . Dr Ladona Ridgel will be her cardio-electrophysiologist and she is pending a new Echo.  Possibly pacemaker - defibrillator candidate.    I have the pleasure of seeing Marie Porter, a -handed Other or two or more races female with a possible sleep disorder.  She has a  has a past medical history of Asthma, Cardiomyopathy (HCC), CHF (congestive heart failure) (HCC), Glaucoma, History of COVID-19, Hypertension, and Migraine.   The patient never had a sleep study.   Sleep relevant medical history: has been Sleep walking in her childhood and college years-, no Tonsillectomy, SOB, CHF,  she had COVID twice- Glaucoma in infancy - had many eye surgeries.    Family medical /sleep history: no other family member on CPAP with OSA, both parents were loud snorers, and they were no overweight. Mother died after a SDH, dementia.   Social history: Patient is working as an Public house manager at [employer], Dr Windle Guard.   but on medical leave now- SOB keeping her from working.  She lives in a household with her spouse, 12 th year of marriage, childless.    Tobacco [place]: never. ETOH use: none,  Caffeine intake in form of Coffee( 3 cups a week) . Regular exercise in form of cardiac rehab.   Hobbies : volunteering       Sleep habits are as follows: The patient's dinner time is between 6-7 PM. The patient goes to bed at 10.30 PM and is promptly asleep- continues to sleep for intervals of 2 hours, wakes from hot flushes, not  bathroom breaks.   The preferred sleep position is prone and on  either side , with the support of 1-2 pillows. Dreams are reportedly frequent/vivid- often nightmarish. The bedroom is cool, quiet and dark.    6 AM is the usual rise time. The patient wakes up spontaneously. She reports not feeling refreshed or restored in AM, with symptoms such as residual fatigue.  Feels unrested. Naps are taken infrequently,  she wants to avoid sleep reduction at night- sets a clock for power nap. lasting from 15-30 minutes and are more refreshing than nocturnal sleep. During her first Covid infection- may 2021, she was febrile and SOB, very disturbing.    Review of Systems: Out of a complete 14 system review, the patient complains of only the following symptoms, and all other reviewed systems are negative.:  Fatigue, sleepiness , snoring, fragmented sleep- unclear  why she wakes up. Sleeps better at the beach.  Had orthopnea during covid.    How likely are you to doze in the following situations: 0 = not likely, 1 = slight chance, 2 = moderate chance, 3 = high chance   Sitting and Reading? Watching Television? Sitting inactive in a public place (theater or meeting)? As a passenger in a car for an hour without a break? Lying down in the afternoon when circumstances permit? Sitting and talking to someone? Sitting quietly after lunch without alcohol? In a car, while stopped for a few minutes in traffic?   Total = 8/ 24 points   FSS endorsed at 9/ 63 points.   Social History   Socioeconomic History   Marital status: Married    Spouse  name: Aryssa Rosamond   Number of children: 0   Years of education: Not on file   Highest education level: Associate degree: occupational, Scientist, product/process development, or vocational program  Occupational History   Occupation: CMA  Tobacco Use   Smoking status: Never   Smokeless tobacco: Never  Substance and Sexual Activity   Alcohol use: Not Currently    Alcohol/week: 1.0 standard drink    Types: 1 Glasses of wine per week   Drug use: No   Sexual activity: Yes    Partners: Male  Other Topics Concern   Not on file  Social History Narrative   Lives with her husband     Left handed   Caffeine: 1 cup of coffee 3x week.    Social Determinants of Health   Financial Resource Strain: Not on file  Food Insecurity: Not on file  Transportation Needs: Not on file  Physical Activity: Not on file  Stress: Not on file  Social Connections: Not on file    Family History  Problem Relation Age of Onset   Hypertension Mother    Stroke Mother    Seizures Mother    Ulcers Mother        duodenal and jejunal   Cancer Father        brain   Seizures Father 40       secondary to brain cancer   Stroke Maternal Grandmother    Hypertension Maternal Grandmother    Diabetes Maternal Grandfather    Heart disease Paternal Grandmother    Pneumonia Paternal Grandfather    Colon cancer Neg Hx    Pancreatic cancer Neg Hx    Stomach cancer Neg Hx     Past Medical History:  Diagnosis Date   Asthma    Cardiomyopathy (HCC)    CHF (congestive heart failure) (HCC)    Glaucoma    History of COVID-19    Hypertension    Migraine     Past Surgical History:  Procedure Laterality Date   EYE SURGERY     glaucoma-multiple in childhood   GANGLION CYST EXCISION Left 10/12/2011   FOOT   RIGHT/LEFT HEART CATH AND CORONARY ANGIOGRAPHY N/A 06/18/2020   Procedure: RIGHT/LEFT HEART CATH AND CORONARY ANGIOGRAPHY;  Surgeon: Elder Negus, MD;  [facility];  Service: Cardiovascular;  Laterality: N/A;      Current Outpatient Medications on File Prior to Visit  Medication Sig Dispense Refill   acetaminophen (TYLENOL) 500 MG tablet Take 500 mg by mouth every 6 (six) hours as needed for moderate pain.     dapagliflozin propanediol (FARXIGA) 10 MG TABS tablet Take 1 tablet (10 mg total) by mouth daily before breakfast. 90 tablet 0  metoprolol succinate (TOPROL-XL) 50 MG 24 hr tablet Take 1 tablet (50 mg total) by mouth every morning. Take with or immediately following a meal. 90 tablet 0   omeprazole (PRILOSEC) 20 MG capsule Take 20 mg by mouth daily as needed (heart burn).     sacubitril-valsartan (ENTRESTO) 97-103 MG Take 1 tablet by mouth 2 (two) times daily. 180 tablet 3   spironolactone (ALDACTONE) 25 MG tablet Take 1 tablet (25 mg total) by mouth daily. 90 tablet 0   rosuvastatin (CRESTOR) 5 MG tablet Take 1 tablet (5 mg total) by mouth at bedtime. 90 tablet 0   No current facility-administered medications on file prior to visit.    No Known Allergies  Physical exam:  Porter's Vitals   09/20/20 0858  BP: 116/70  Pulse: 60  Weight: 205 lb (93 kg)  Height: 5' 1.5" (1.562 m)   Body mass index is 38.11 kg/m.   Wt Readings from Last 3 Encounters:  09/20/20 205 lb (93 kg)  08/12/20 198 lb (89.8 kg)  07/15/20 196 lb (88.9 kg)     Ht Readings from Last 3 Encounters:  09/20/20 5' 1.5" (1.562 m)  08/12/20 5\' 1"  (1.549 m)  07/15/20 5\' 1"  (1.549 m)      General: The patient is awake, alert and appears not in acute distress. The patient is well groomed. Head: Normocephalic, atraumatic. Neck is supple.  Mallampati : 1,  neck circumference:14 inches . Nasal airflow  patent.  Retrognathia is not  seen.  Dental status: biological teeth Cardiovascular:  irregular rate and cardiac rhythm by pulse, leaky valve mitral. Respiratory: Lungs are clear to auscultation.  Skin:  Without evidence of ankle edema, or rash. Trunk: The patient's posture is erect.   Neurologic exam : The patient  is awake and alert, oriented to place and time.   Memory subjective described as intact.  Attention span & concentration ability appears normal.  Speech is fluent,  without  dysarthria, dysphonia or aphasia.  Mood and affect are appropriate.   Cranial nerves: no loss of smell or taste reported recovered after first covid infection   Pupils are equal and briskly reactive to light. Funduscopic exam deferred.  Extraocular movements in vertical and horizontal planes were intact and without nystagmus.  No Diplopia. Visual fields by finger perimetry are intact. Hearing was intact to soft voice and finger rubbing.    Facial sensation intact to fine touch.  Facial motor strength is symmetric and tongue and uvula move midline.  Neck ROM : rotation, tilt and flexion extension were normal for age and shoulder shrug was symmetrical.    Motor exam:  Symmetric bulk, tone and ROM.   Normal tone without cog -wheeling, symmetric grip strength .   Sensory:  Fine touch and vibration were normal.  Proprioception tested in the upper extremities was normal.   Coordination: Rapid alternating movements in the fingers/hands were of normal speed.  The Finger-to-nose maneuver was intact without evidence of ataxia, dysmetria or tremor.   Gait and station: Patient could rise unassisted from a seated position, walked without assistive device.  Stance is of normal width/ base and the patient turned with 3 steps.  Toe and heel walk were deferred.  Deep tendon reflexes: in the  upper and lower extremities are symmetric and intact.  Babinski response was deferred normal        After spending a total time of  35  minutes face to face and additional time for physical and neurologic examination,  review of laboratory studies,  personal review of imaging studies, reports and results of other testing and review of referral information / records as far as provided in visit, I have established the following  assessments:  1) severely decreased EF, enlarged heart- CHF with EF of 15-20% 2) high risk of central apnea and hypoxia. HYPOKINETIC.  3) valve disease is secondarily to enlarged cardiomyopathy.  4) she has no ankle edema-    My Plan is to proceed with:  1) Marie Porter is on fluid restriction and on a Dash diet with low sodium in her case no sodium added to her food.  Her BMI is 38.  She has a lot of hunger she feels famished during the day and it is quite frustrating to prove that she is unable to lose the weight.  She does seem not to carry a whole lot of fluid weight with her and her ankles are not swollen.  She remains short of breath with minimal exertion related to her hypokinetic enlarged heart.  I would like for her to undergo a sleep study and prefer an attended sleep study.   PLAN B:  If this is not possible by insurance authorization or other administrative changes I will go with a home sleep test first to rule out obstructive sleep apnea.  My concern however is that she is at high risk for central sleep apnea and that this may not be captured on a home sleep test as well.    I would like to thank Lance Bosch, NP and Lance Bosch, Np [address] [place],  [place] [address] for allowing me to meet with and to take care of this pleasant patient.   In short, Marie Porter is presenting with CHF.   I plan to follow up either personally or through our NP within 2-4 month.   CC: I will share my notes with Dr. Odis Hollingshead.  .  Electronically signed by: Melvyn Novas, MD 09/20/2020 9:15 AM  [facility] Board certified by The [employer] of Sleep Medicine and Diplomate of the [employer] of Sleep Medicine. Board certified In Neurology through the ABPN, Fellow of the [employer] of Neurology. Medical Director of [facility].

## 2020-10-29 ENCOUNTER — Ambulatory Visit: Payer: PRIVATE HEALTH INSURANCE

## 2020-10-29 ENCOUNTER — Other Ambulatory Visit: Payer: Self-pay

## 2020-10-29 DIAGNOSIS — I428 Other cardiomyopathies: Secondary | ICD-10-CM

## 2020-10-29 DIAGNOSIS — I5042 Chronic combined systolic (congestive) and diastolic (congestive) heart failure: Secondary | ICD-10-CM

## 2020-10-29 DIAGNOSIS — I447 Left bundle-branch block, unspecified: Secondary | ICD-10-CM

## 2020-11-05 ENCOUNTER — Ambulatory Visit (INDEPENDENT_AMBULATORY_CARE_PROVIDER_SITE_OTHER): Payer: PRIVATE HEALTH INSURANCE | Admitting: Internal Medicine

## 2020-11-05 ENCOUNTER — Other Ambulatory Visit: Payer: Self-pay

## 2020-11-05 VITALS — BP 104/68 | HR 75 | Ht 61.5 in | Wt 211.4 lb

## 2020-11-05 DIAGNOSIS — I447 Left bundle-branch block, unspecified: Secondary | ICD-10-CM

## 2020-11-05 DIAGNOSIS — I428 Other cardiomyopathies: Secondary | ICD-10-CM

## 2020-11-05 DIAGNOSIS — I5042 Chronic combined systolic (congestive) and diastolic (congestive) heart failure: Secondary | ICD-10-CM | POA: Diagnosis not present

## 2020-11-05 NOTE — Progress Notes (Signed)
Spoke to patient she said she had an appt with Dr. Ladona Ridgel

## 2020-11-05 NOTE — Patient Instructions (Signed)
Medication Instructions:  Your physician recommends that you continue on your current medications as directed. Please refer to the Current Medication list given to you today.  *If you need a refill on your cardiac medications before your next appointment, please call your pharmacy*   Lab Work: None ordered If you have labs (blood work) drawn today and your tests are completely normal, you will receive your results only by: MyChart Message (if you have MyChart) OR A paper copy in the mail If you have any lab test that is abnormal or we need to change your treatment, we will call you to review the results.   Testing/Procedures: Your physician has recommended that you have a defibrillator inserted. An implantable cardioverter defibrillator (ICD) is a small device that is placed in your chest or, in rare cases, your abdomen. This device uses electrical pulses or shocks to help control life-threatening, irregular heartbeats that could lead the heart to suddenly stop beating (sudden cardiac arrest). Leads are attached to the ICD that goes into your heart. This is done in the hospital and usually requires an overnight stay.   Please call office/send mychart message when you are ready to schedule  The following dates are available (these are subject to change): 9/22, 10/3, 10/17, 10/24, 10/27   Follow-Up: At [facility], you and your health needs are our priority.  As part of our continuing mission to provide you with exceptional heart care, we have created designated Provider Care Teams.  These Care Teams include your primary Cardiologist (physician) and Advanced Practice Providers (APPs -  Physician Assistants and Nurse Practitioners) who all work together to provide you with the care you need, when you need it.  Your next appointment:   To be determined  The format for your next appointment:   In Person  Provider:   Lewayne Bunting, MD    Thank you for choosing [facility]!!   Dory Horn, RN [phone]   Other Instructions   Call main office number and choose billing option      [phone]

## 2020-11-05 NOTE — Progress Notes (Signed)
HPI Marie Porter is referred by Dr. Odis Hollingshead for consideration for BIV ICD insertion. She is a pleasant 60 yo woman with a h/o obesity and covid infection who has developed a cardiomyopathy and has been placed on maximal medical therapy. She has undergone repeat 2D echo and her EF remains less that 20%. She has not had syncope or symptoms of either brady or tachycardia. She does not have obstructive CAD.  No Known Allergies   Current Outpatient Medications  Medication Sig Dispense Refill   acetaminophen (TYLENOL) 500 MG tablet Take 500 mg by mouth every 6 (six) hours as needed for moderate pain.     metoprolol succinate (TOPROL-XL) 50 MG 24 hr tablet Take 1 tablet (50 mg total) by mouth every morning. Take with or immediately following a meal. 90 tablet 0   omeprazole (PRILOSEC) 20 MG capsule Take 20 mg by mouth daily as needed (heart burn).     sacubitril-valsartan (ENTRESTO) 97-103 MG Take 1 tablet by mouth 2 (two) times daily. 180 tablet 3   spironolactone (ALDACTONE) 25 MG tablet Take 1 tablet (25 mg total) by mouth daily. 90 tablet 0   rosuvastatin (CRESTOR) 5 MG tablet Take 1 tablet (5 mg total) by mouth at bedtime. 90 tablet 0   No current facility-administered medications for this visit.     Past Medical History:  Diagnosis Date   Asthma    Cardiomyopathy (HCC)    CHF (congestive heart failure) (HCC)    Glaucoma    History of COVID-19    Hypertension    Migraine     ROS:   All systems reviewed and negative except as noted in the HPI.   Past Surgical History:  Procedure Laterality Date   EYE SURGERY     glaucoma-multiple in childhood   GANGLION CYST EXCISION Left 10/12/2011   FOOT   RIGHT/LEFT HEART CATH AND CORONARY ANGIOGRAPHY N/A 06/18/2020   Procedure: RIGHT/LEFT HEART CATH AND CORONARY ANGIOGRAPHY;  Surgeon: Elder Negus, MD;  Location[facility];  Service: Cardiovascular;  Laterality: N/A;     Family History  Problem Relation Age of  Onset   Hypertension Mother    Stroke Mother    Seizures Mother    Ulcers Mother        duodenal and jejunal   Cancer Father        brain   Seizures Father 34       secondary to brain cancer   Stroke Maternal Grandmother    Hypertension Maternal Grandmother    Diabetes Maternal Grandfather    Heart disease Paternal Grandmother    Pneumonia Paternal Grandfather    Colon cancer Neg Hx    Pancreatic cancer Neg Hx    Stomach cancer Neg Hx      Social History   Socioeconomic History   Marital status: Married    Spouse name: Marie Porter   Number of children: 0   Years of education: Not on file   Highest education level: Associate degree: occupational, Scientist, product/process development, or vocational program  Occupational History   Occupation: CMA  Tobacco Use   Smoking status: Never   Smokeless tobacco: Never  Substance and Sexual Activity   Alcohol use: Not Currently    Alcohol/week: 1.0 standard drink    Types: 1 Glasses of wine per week   Drug use: No   Sexual activity: Yes    Partners: Male  Other Topics Concern   Not on file  Social  History Narrative   Lives with her husband     Left handed   Caffeine: 1 cup of coffee 3x week.    Social Determinants of Health   Financial Resource Strain: Not on file  Food Insecurity: Not on file  Transportation Needs: Not on file  Physical Activity: Not on file  Stress: Not on file  Social Connections: Not on file  Intimate Partner Violence: Not on file     BP 104/68   Pulse 75   Ht 5' 1.5" (1.562 m)   Wt 211 lb 6.4 oz (95.9 kg)   LMP 01/13/2013 Comment: perimenopause  SpO2 98%   BMI 39.30 kg/m   Physical Exam:  Well appearing NAD HEENT: Unremarkable Neck:  No JVD, no thyromegally Lymphatics:  No adenopathy Back:  No CVA tenderness Lungs:  Clear HEART:  Regular rate rhythm, no murmurs, no rubs, no clicks Abd:  soft, positive bowel sounds, no organomegally, no rebound, no guarding Ext:  2 plus pulses, no edema, no cyanosis, no  clubbing Skin:  No rashes no nodules Neuro:  CN II through XII intact, motor grossly intact  EKG - reviewed. NSR with LBBB  Assess/Plan:  1, non-ischemic CM - she has been treated with GDMT and has class 3symptoms. She will continue her current meds. I asked her whether she would like to undergo ICD insertion and she will let us know. She will require a biv ICD.  2. Chronic systolic heart failure - her symptoms are class 2. She will continue her current meds.  3. Dyslipidemia - she has had leg cramps on crestor and I have encouraged her to stop this medicaton. She does not have obstructive CAD.    Marie Porter Marie Reining,MD

## 2020-11-10 ENCOUNTER — Other Ambulatory Visit: Payer: Self-pay

## 2020-11-10 ENCOUNTER — Telehealth: Payer: Self-pay | Admitting: Cardiology

## 2020-11-10 DIAGNOSIS — I5042 Chronic combined systolic (congestive) and diastolic (congestive) heart failure: Secondary | ICD-10-CM

## 2020-11-10 DIAGNOSIS — I428 Other cardiomyopathies: Secondary | ICD-10-CM

## 2020-11-10 MED ORDER — SPIRONOLACTONE 25 MG PO TABS: 25.0000 mg | ORAL_TABLET | Freq: Every day | ORAL | 0 refills | Status: DC

## 2020-11-10 NOTE — Telephone Encounter (Signed)
Done

## 2020-11-10 NOTE — Telephone Encounter (Signed)
Pt requesting refill for aldactone

## 2020-11-23 ENCOUNTER — Telehealth: Payer: Self-pay

## 2020-11-23 NOTE — Telephone Encounter (Signed)
Spoke with patient about scheduling sleep study. Patient is wanting to wait due to other medical things going on right now. Patient will call us back when she is ready to schedule.

## 2020-11-25 ENCOUNTER — Other Ambulatory Visit: Payer: Self-pay

## 2020-11-25 DIAGNOSIS — I428 Other cardiomyopathies: Secondary | ICD-10-CM

## 2020-11-25 DIAGNOSIS — I5042 Chronic combined systolic (congestive) and diastolic (congestive) heart failure: Secondary | ICD-10-CM

## 2020-11-25 MED ORDER — METOPROLOL SUCCINATE ER 50 MG PO TB24: 50.0000 mg | ORAL_TABLET | Freq: Every morning | ORAL | 0 refills | Status: DC

## 2020-12-17 ENCOUNTER — Telehealth: Payer: Self-pay | Admitting: Cardiology

## 2020-12-17 NOTE — Telephone Encounter (Signed)
Please formulate a letter stating that Ms. Scriven has been evaluated and treated for congestive heart failure at our office.

## 2020-12-17 NOTE — Telephone Encounter (Signed)
Patient requesting a letter stating her diagnosis of congestive heart failure due to having had covid twice. She says she will come pick it up when it is complete. She says she would prefer to have this done sooner rather than later if possible.

## 2020-12-17 NOTE — Telephone Encounter (Signed)
Please let her know she has not followed up in atleast 36months.  She needs to be seen regularly given her medical therapy.  For what is this letter for.  ST

## 2020-12-22 NOTE — Telephone Encounter (Signed)
Letter has been done patient is aware to pick whenever she can

## 2021-01-28 ENCOUNTER — Other Ambulatory Visit: Payer: Self-pay

## 2021-01-28 MED ORDER — SACUBITRIL-VALSARTAN 97-103 MG PO TABS
1.0000 | ORAL_TABLET | Freq: Two times a day (BID) | ORAL | 0 refills | Status: DC
Start: 2021-01-28 — End: 2021-07-04

## 2021-02-15 ENCOUNTER — Other Ambulatory Visit: Payer: Self-pay | Admitting: Cardiology

## 2021-02-15 DIAGNOSIS — I428 Other cardiomyopathies: Secondary | ICD-10-CM

## 2021-02-15 DIAGNOSIS — I5042 Chronic combined systolic (congestive) and diastolic (congestive) heart failure: Secondary | ICD-10-CM

## 2021-02-25 ENCOUNTER — Other Ambulatory Visit: Payer: Self-pay | Admitting: Cardiology

## 2021-02-25 DIAGNOSIS — I5042 Chronic combined systolic (congestive) and diastolic (congestive) heart failure: Secondary | ICD-10-CM

## 2021-02-25 DIAGNOSIS — I428 Other cardiomyopathies: Secondary | ICD-10-CM

## 2021-05-19 ENCOUNTER — Other Ambulatory Visit: Payer: Self-pay | Admitting: Cardiology

## 2021-05-19 DIAGNOSIS — I5042 Chronic combined systolic (congestive) and diastolic (congestive) heart failure: Secondary | ICD-10-CM

## 2021-05-19 DIAGNOSIS — I428 Other cardiomyopathies: Secondary | ICD-10-CM

## 2021-05-30 ENCOUNTER — Other Ambulatory Visit: Payer: Self-pay | Admitting: Cardiology

## 2021-05-30 DIAGNOSIS — I5042 Chronic combined systolic (congestive) and diastolic (congestive) heart failure: Secondary | ICD-10-CM

## 2021-05-30 DIAGNOSIS — I428 Other cardiomyopathies: Secondary | ICD-10-CM

## 2021-06-29 ENCOUNTER — Other Ambulatory Visit: Payer: Self-pay | Admitting: Cardiology

## 2021-06-29 DIAGNOSIS — I428 Other cardiomyopathies: Secondary | ICD-10-CM

## 2021-06-29 DIAGNOSIS — I5042 Chronic combined systolic (congestive) and diastolic (congestive) heart failure: Secondary | ICD-10-CM

## 2021-07-04 ENCOUNTER — Encounter: Payer: Self-pay | Admitting: Cardiology

## 2021-07-04 ENCOUNTER — Ambulatory Visit: Payer: PRIVATE HEALTH INSURANCE | Admitting: Cardiology

## 2021-07-04 VITALS — BP 140/93 | HR 104 | Temp 98.0°F | Resp 16 | Ht 61.0 in | Wt 195.0 lb

## 2021-07-04 DIAGNOSIS — I5042 Chronic combined systolic (congestive) and diastolic (congestive) heart failure: Secondary | ICD-10-CM

## 2021-07-04 DIAGNOSIS — I428 Other cardiomyopathies: Secondary | ICD-10-CM

## 2021-07-04 MED ORDER — METOPROLOL SUCCINATE ER 50 MG PO TB24: 50.0000 mg | ORAL_TABLET | Freq: Every day | ORAL | 3 refills | Status: DC

## 2021-07-04 MED ORDER — SPIRONOLACTONE 25 MG PO TABS: 25.0000 mg | ORAL_TABLET | Freq: Every day | ORAL | 3 refills | Status: DC

## 2021-07-04 MED ORDER — LOSARTAN POTASSIUM 50 MG PO TABS: 50.0000 mg | ORAL_TABLET | Freq: Every day | ORAL | 3 refills | Status: DC

## 2021-07-04 NOTE — Progress Notes (Signed)
? ?Follow up visit ? ?Subjective:  ? ?Marie Porter, female    DOB: 06-11-1960, 61 y.o.   MRN: [id number] ? ? ?HPI ? ?Chief Complaint  ?Patient presents with  ? Congestive Heart Failure  ? Medication Management  ? ? ?61 year old female with nonischemic cardiomyopathy ? ?Patient was previously saw Dr/ Odis Hollingshead. She had not followed up in last few moths. She has been out of Entresto in last 3 weeks. Metoprolol for 3 days. She denies any overt dyspnea, edema.  ? ?She received patient assistance for 6 months for Entresto, but was denies further assistance, as her husbands income is higher than that eligible for patient assistance. She was referred for CT, but she opted not to.  ? ?Patient has limited insurance with very high deductible. ? ? ? ?Current Outpatient Medications:  ?  acetaminophen (TYLENOL) 500 MG tablet, Take 500 mg by mouth every 6 (six) hours as needed for moderate pain., Disp: , Rfl:  ?  metoprolol succinate (TOPROL-XL) 50 MG 24 hr tablet, TAKE 1 TABLET BY MOUTH EACH MORNING WITHOR IMMEDIATELY FOLLOWING A MEAL, Disp: 30 tablet, Rfl: 0 ?  omeprazole (PRILOSEC) 20 MG capsule, Take 20 mg by mouth daily as needed (heart burn)., Disp: , Rfl:  ?  sacubitril-valsartan (ENTRESTO) 97-103 MG, Take 1 tablet by mouth 2 (two) times daily., Disp: 180 tablet, Rfl: 0 ?  spironolactone (ALDACTONE) 25 MG tablet, TAKE 1 TABLET BY MOUTH DAILY, Disp: 90 tablet, Rfl: 0  ? ?Cardiovascular & other pertient studies: ? ?Reviewed external labs and tests, independently interpreted ? ?EKG 07/04/2021: ?Sinus tachycardia 111 bpm ?LBBB ?First degree AV block ? ? ?Echocardiogram 10/29/2020:  ?Severely depressed LV systolic function with visual EF <20%. Left  ?ventricle cavity is dilated. Normal left ventricular wall thickness.  ?Hypokinetic global wall motion. Doppler evidence of grade I (impaired)  ?diastolic dysfunction, normal LAP.  ?Mild (Grade I) mitral regurgitation.  ?Mild tricuspid regurgitation. No evidence of pulmonary  hypertension, RVSP  ? .  ?Mild pulmonic regurgitation.  ?Insignificant pericardial effusion located posteriorly.  ?IVC is dilated with a respiratory response of >50%, estimated RAP .  ?Compared to study 05/20/2020: G3DD is now G1DD, LAP is now normal,  ?Moderate MR and TR is now mild, otherwise no significant change.  ? ?LHC/RHC 05/2020: ?No coronary artery disease ?Normal filling pressures ?Compensated nonischemic cardiomyopathy ? ?Recent labs: ?Not available ? ? ? ?Review of Systems  ?Cardiovascular:  Negative for chest pain, dyspnea on exertion, leg swelling, palpitations and syncope.  ? ?   ? ? ?Vitals:  ? 07/04/21 1408 07/04/21 1414  ?BP: (!) 134/100 (!) 140/93  ?Pulse: 64 (!) 104  ?Resp: 16   ?Temp: 98 ?F (36.7 ?C)   ?SpO2: 95% 95%  ? ? ?Body mass index is 36.84 kg/m?. ?Filed Weights  ? 07/04/21 1408  ?Weight: 195 lb (88.5 kg)  ? ? ? ?Objective:  ? Physical Exam ?Vitals and nursing note reviewed.  ?Constitutional:   ?   General: She is not in acute distress. ?Neck:  ?   Vascular: No JVD.  ?Cardiovascular:  ?   Rate and Rhythm: Regular rhythm. Tachycardia present.  ?   Heart sounds: Normal heart sounds. No murmur heard. ?Pulmonary:  ?   Effort: Pulmonary effort is normal.  ?   Breath sounds: Normal breath sounds. No wheezing or rales.  ?Musculoskeletal:  ?   Right lower leg: No edema.  ?   Left lower leg: No edema.  ? ? ? ? ? ?   ? ? ?  Visit diagnoses: ?  ICD-10-CM   ?1. Chronic combined systolic and diastolic CHF, NYHA class 3 (HCC)  I50.42 EKG 12-Lead  ?  ?  ? ?Orders Placed This Encounter  ?Procedures  ? Basic metabolic panel  ? B Nat Peptide  ? Basic metabolic panel  ? EKG 12-Lead  ? PCV ECHOCARDIOGRAM COMPLETE  ? ?Meds ordered this encounter  ?Medications  ? metoprolol succinate (TOPROL-XL) 50 MG 24 hr tablet  ?  Sig: Take 1 tablet (50 mg total) by mouth daily. Take with or immediately following a meal.  ?  Dispense:  90 tablet  ?  Refill:  3  ?  PATIENT NEEDS APPOINTMENT FOR ADDITIONAL REFILLS  ?  spironolactone (ALDACTONE) 25 MG tablet  ?  Sig: Take 1 tablet (25 mg total) by mouth daily.  ?  Dispense:  90 tablet  ?  Refill:  3  ? losartan (COZAAR) 50 MG tablet  ?  Sig: Take 1 tablet (50 mg total) by mouth daily.  ?  Dispense:  90 tablet  ?  Refill:  3  ? ? ? ?Assessment & Recommendations:  ? ? ?61 year old female with nonischemic cardiomyopathy ? ?HFrEF: ?Resume metoprolol succinate 50 mg daily.  ?Not able to afford Entresto. Prescribed losartan 50 mg daily.  ?Refilled spironolactone 50 mg daily. ?BMP and BNP today, repeat BMP in 1 week.  ?Echocardiogram in 6 weeks. ?F/u after that w/Dr Odis Hollingshead ? ? ? ? ? ? ?Elder Negus, MD ?Pager: [phone] ?Office: [phone] ?

## 2021-07-26 NOTE — Progress Notes (Signed)
External Labs: Collected: Jul 11, 2021 Sodium 142, potassium 4.8, chloride 106, bicarb 22. BUN 18, creatinine 0.87  Medical assistants: Please inform the patient that kidney function and electrolytes are stable after transitioning to losartan and staying on spironolactone.  Menucha Dicesare Derry, DO, [facility]

## 2021-07-26 NOTE — Progress Notes (Signed)
External Labs: Collected: Jul 04, 2021 BUN 24, creatinine 1.08. eGFR 59. Sodium 141, potassium 5, chloride 105, bicarb 19 BNP 75

## 2021-07-28 NOTE — Progress Notes (Signed)
Called and spoke with patient regarding her recent labs results.

## 2021-08-15 ENCOUNTER — Ambulatory Visit: Payer: PRIVATE HEALTH INSURANCE

## 2021-08-15 DIAGNOSIS — I5042 Chronic combined systolic (congestive) and diastolic (congestive) heart failure: Secondary | ICD-10-CM

## 2021-08-15 DIAGNOSIS — I428 Other cardiomyopathies: Secondary | ICD-10-CM

## 2021-08-19 NOTE — Progress Notes (Signed)
Seeing you on 7/11.

## 2021-08-25 ENCOUNTER — Ambulatory Visit: Payer: PRIVATE HEALTH INSURANCE | Admitting: Cardiology

## 2021-08-30 ENCOUNTER — Encounter: Payer: Self-pay | Admitting: Cardiology

## 2021-08-30 ENCOUNTER — Ambulatory Visit: Payer: PRIVATE HEALTH INSURANCE | Admitting: Cardiology

## 2021-08-30 VITALS — BP 110/71 | HR 68 | Temp 98.0°F | Resp 17 | Ht 61.0 in | Wt 200.0 lb

## 2021-08-30 DIAGNOSIS — Z8616 Personal history of COVID-19: Secondary | ICD-10-CM

## 2021-08-30 DIAGNOSIS — I5042 Chronic combined systolic (congestive) and diastolic (congestive) heart failure: Secondary | ICD-10-CM

## 2021-08-30 DIAGNOSIS — I447 Left bundle-branch block, unspecified: Secondary | ICD-10-CM

## 2021-08-30 DIAGNOSIS — I428 Other cardiomyopathies: Secondary | ICD-10-CM

## 2021-08-30 NOTE — Progress Notes (Signed)
Date:  08/30/2021   ID:  Marie Porter, DOB Sep 14, 1960, MRN [id number]  PCP:  Ferd Hibbs, NP  Cardiologist: Rex Kras, DO, [facility] (established care 06/07/2020)  Date: 08/30/21 Last Office Visit: 07/04/2021  Chief Complaint  Patient presents with   Follow-up    Heart failure    HPI  Marie Porter is a 61 y.o. female whose past medical history and cardiovascular risk factors include: Hx of COVID 19 infection (May 2021 and January 2022), nonischemic cardiomyopathy, chronic HFrEF, history of glaucoma, hypertension, obesity due to excess calories.  Patient was last seen in the office in May 2023 by my partner Dr. Virgina Jock at that time she was transitioned from [facility] to losartan as Delene Loll was cost prohibitive and was given a refill for spironolactone as well.  Patient has tolerated both medications well and her blood pressures have improved.  She had recent labs which noted stable renal function, BNP within normal limits, and potassium within normal limits as well.  Repeat echocardiogram still illustrates severely reduced LVEF <20%.  Results noted below for further reference.  She walks her dog at least 2 miles per day without effort related symptoms of chest pain or shortness of breath.  She has gained weight over the last 2 weeks due to dietary indiscretion over Fourth of July weekend.  But is working steadily to lose weight.  She has been evaluated by cardiac electrophysiologist Dr. Lovena Le for BiV ICD and patient still has not made up her decision.  We discussed and answered her current questions to her satisfaction.  FUNCTIONAL STATUS: Walks 3-4 miles at work but no structured exercise program or daily routine.   ALLERGIES: No Known Allergies  MEDICATION LIST PRIOR TO VISIT: Current Meds  Medication Sig   acetaminophen (TYLENOL) 500 MG tablet Take 500 mg by mouth every 6 (six) hours as needed for moderate pain.   dapagliflozin propanediol (FARXIGA) 10 MG  TABS tablet Take 10 mg by mouth daily.   losartan (COZAAR) 50 MG tablet Take 1 tablet (50 mg total) by mouth daily.   metoprolol succinate (TOPROL-XL) 50 MG 24 hr tablet TAKE 1 TABLET BY MOUTH EACH MORNING WITHOR IMMEDIATELY FOLLOWING A MEAL   omeprazole (PRILOSEC) 20 MG capsule Take 20 mg by mouth daily as needed (heart burn).   spironolactone (ALDACTONE) 25 MG tablet Take 1 tablet (25 mg total) by mouth daily.   [DISCONTINUED] metoprolol succinate (TOPROL-XL) 50 MG 24 hr tablet Take 1 tablet (50 mg total) by mouth daily. Take with or immediately following a meal.     PAST MEDICAL HISTORY: Past Medical History:  Diagnosis Date   Asthma    Cardiomyopathy ([place])    CHF (congestive heart failure) ([place])    Glaucoma    History of COVID-19    Hypertension    Migraine     PAST SURGICAL HISTORY: Past Surgical History:  Procedure Laterality Date   EYE SURGERY     glaucoma-multiple in childhood   GANGLION CYST EXCISION Left 10/12/2011   FOOT   RIGHT/LEFT HEART CATH AND CORONARY ANGIOGRAPHY N/A 06/18/2020   Procedure: RIGHT/LEFT HEART CATH AND CORONARY ANGIOGRAPHY;  Surgeon: Nigel Mormon, MD;  Location: [facility];  Service: Cardiovascular;  Laterality: N/A;    FAMILY HISTORY: The patient family history includes Bladder Cancer (age of onset: 60) in her brother; Cancer in her father; Diabetes in her maternal grandfather; Heart disease in her paternal grandmother; Hypertension in her maternal grandmother and mother; Pneumonia in her  paternal grandfather; Seizures in her mother; Seizures (age of onset: 37) in her father; Stroke in her maternal grandmother and mother; Ulcers in her mother.  SOCIAL HISTORY:  The patient  reports that she has never smoked. She has never used smokeless tobacco. She reports that she does not currently use alcohol after a past usage of about 1.0 standard drink of alcohol per week. She reports that she does not use drugs.  REVIEW OF SYSTEMS: Review of  Systems  Cardiovascular:  Positive for dyspnea on exertion. Negative for chest pain, leg swelling, orthopnea, palpitations, paroxysmal nocturnal dyspnea and syncope.  Respiratory:  Negative for shortness of breath.     PHYSICAL EXAM:    08/30/2021   12:01 PM 07/04/2021    2:14 PM 07/04/2021    2:08 PM  Vitals with BMI  Height '5\' 1"'   '5\' 1"'   Weight 200 lbs  195 lbs  BMI 21.97  58.83  Systolic 254 982 641  Diastolic 71 93 583  Pulse 68 104 64    CONSTITUTIONAL: Well-developed and well-nourished. No acute distress.  SKIN: Skin is warm and dry. No rash noted. No cyanosis. No pallor. No jaundice HEAD: Normocephalic and atraumatic.  EYES: No scleral icterus MOUTH/THROAT: Moist oral membranes.  NECK: No JVD present. No thyromegaly noted. No carotid bruits  CHEST Normal respiratory effort. No intercostal retractions  LUNGS: Clear to auscultation bilaterally.  No stridor. No wheezes. No rales.  CARDIOVASCULAR: Regular rate and rhythm, positive E9-M0, holosystolic murmur heard at the apex, without any gallops or rubs. ABDOMINAL: Obese, soft, nontender, nondistended, positive bowel sounds in all 4 quadrants.  No apparent ascites.  EXTREMITIES: No peripheral edema, 2+ dorsalis pedis and posterior tibial pulses bilaterally. HEMATOLOGIC: No significant bruising NEUROLOGIC: Oriented to person, place, and time. Nonfocal. Normal muscle tone.  PSYCHIATRIC: Normal mood and affect. Normal behavior. Cooperative  CARDIAC DATABASE: EKG: EKG 07/04/2021: Sinus tachycardia 111 bpm LBBB  Echocardiogram: 08/15/2021:  Left ventricle cavity is severely dilated. Normal left ventricular wall thickness. Abnormal septal wall motion due to LBBB. Severe global hypokinesis. LVEF <20%. Doppler evidence of grade I (impaired) diastolic dysfunction, normal LAP.  Left atrial cavity is mildly dilated.  Mild (Grade I) mitral regurgitation.  Mild tricuspid regurgitation.  No evidence of pulmonary hypertension.   Compared to previous study on 10/29/2020, pericardial effusion not appreciated. No other significant change noted.    Stress Testing: No results found for this or any previous visit from the past 1095 days.  Heart Catheterization: None  Right and Left Heart Cath:  06/18/2020:  No coronary artery disease Normal filling pressures Compensated nonischemic cardiomyopathy  LABORATORY DATA:    Latest Ref Rng & Units 06/18/2020    1:55 PM 12/15/2018    3:57 PM 02/23/2013   12:30 PM  CBC  WBC 4.0 - 10.5 K/uL  7.1  9.2   Hemoglobin 12.0 - 15.0 g/dL 13.6  13.7  14.6   Hematocrit 36.0 - 46.0 % 40.0  41.2  45.3   Platelets 150 - 400 K/uL  318         Latest Ref Rng & Units 06/18/2020    1:55 PM 12/15/2018    3:57 PM 12/31/2012    8:23 AM  CMP  Glucose 70 - 99 mg/dL  92  82   BUN 6 - 20 mg/dL  18  10   Creatinine 0.44 - 1.00 mg/dL  1.03  0.68   Sodium 135 - 145 mmol/L 143  141  141  Potassium 3.5 - 5.1 mmol/L 3.6  4.0  4.1   Chloride 98 - 111 mmol/L  109  105   CO2 22 - 32 mmol/L  22  26   Calcium 8.9 - 10.3 mg/dL  9.5  8.9   Total Protein 6.0 - 8.3 g/dL   6.5   Total Bilirubin 0.3 - 1.2 mg/dL   0.5   Alkaline Phos 39 - 117 U/L   69   AST 0 - 37 U/L   15   ALT 0 - 35 U/L   14     Lipid Panel     Component Value Date/Time   CHOL 128 12/31/2012 0823   TRIG 56 12/31/2012 0823   HDL 50 12/31/2012 0823   CHOLHDL 2.6 12/31/2012 0823   VLDL 11 12/31/2012 0823   LDLCALC 67 12/31/2012 0823    No components found for: "NTPROBNP" No results for input(s): "PROBNP" in the last 8760 hours. No results for input(s): "TSH" in the last 8760 hours.  BMP No results for input(s): "NA", "K", "CL", "CO2", "GLUCOSE", "BUN", "CREATININE", "CALCIUM", "GFRNONAA", "GFRAA" in the last 8760 hours.   HEMOGLOBIN A1C No results found for: "HGBA1C", "MPG"   External Labs:  Date Collected: 05/03/2020 , information obtained by [facility] Potassium: 4.7 Creatinine 0.95 mg/dL. eGFR: 69 mL/min per 1.73  m Hematocrit: 43.1 % Lipid profile: Total cholesterol 186 , triglycerides 66 , HDL 64 , LDL 110 AST: 22 , ALT: 32 , alkaline phosphatase: 97 BNP 691 (761 04/27/2020) TSH: 2.220  External labs: 06/03/2020: Records provided by the patient. Sodium 144, potassium 4.6, chloride 104, bicarb 22, creatinine 0.84, BUN 19, BNP 413  06/16/2020: Sodium 146, potassium 5.1, chloride 107, bicarb 23, BUN 17, creatinine 0.82, BNP 438 magnesium 2.1  External Labs: Collected: 07/22/2020 performed at her PCPs office. Creatinine 0.81 mg/dL. eGFR: 84 mL/min per 1.73 m NT proBNP 1495 Sodium 144 potassium 4.9, chloride 106, bicarb 22  Collected: 07/29/2020. Sodium 141, potassium 4.7, chloride 110, bicarb 17, BUN 16, creatinine 0.78, NT proBNP 1118. Magnesium 2.1  External Labs: Collected: Jul 04, 2021 BUN 24, creatinine 1.08. eGFR 59. Sodium 141, potassium 5, chloride 105, bicarb 19 BNP 75  Collected: Jul 11, 2021 Sodium 142, potassium 4.8, chloride 106, bicarb 22. BUN 18, creatinine 0.87   Medical assistants: Please inform the patient that kidney function and electrolytes are stable after transitioning to losartan and staying on spironolactone.   IMPRESSION:    ICD-10-CM   1. Nonischemic cardiomyopathy (HCC)  I42.8     2. Chronic combined systolic and diastolic heart failure (HCC)  I50.42     3. LBBB (left bundle branch block)  I44.7     4. History of COVID-19  Z86.16         RECOMMENDATIONS: Marie Porter is a 61 y.o. female whose past medical history and cardiac risk factors include: Hx of COVID 43 infection (May 2021 and January 2022), chronic congestive heart failure, cardiomyopathy, history of glaucoma, hypertension, obesity due to excess calories.  Nonischemic cardiomyopathy (HCC) Stage B, NYHA class II/III. Has undergone echocardiogram and heart catheterization. We have discussed undergoing cardiac MRI in the past to evaluate for other causes of nonischemic cardiomyopathy.   However it is cost prohibitive per patient. She is also been evaluated by cardiac electrophysiology for BiV ICD -patient still has not made a definitive decision.  Chronic combined systolic and diastolic heart failure (HCC) Compensated. Stage B, NYHA class II/III Delene Loll has become cost prohibitive and she is  transition to losartan. Has tolerated initiation of spironolactone well. Recent labs from May 2023 independently reviewed.  Repeat echocardiogram notes LVEF of <20%. Patient is still considering undergoing BiV ICD.  She plans to follow-up with Dr. Lovena Le when she is ready and/or has additional questions or concerns.  LBBB (left bundle branch block) Chronic and stable  History of COVID-19 Patient has had Covid in May 2021 as well as January 2022. Patient was against the COVID-19 vaccination.  FINAL MEDICATION LIST END OF ENCOUNTER: No orders of the defined types were placed in this encounter.   Medications Discontinued During This Encounter  Medication Reason   metoprolol succinate (TOPROL-XL) 50 MG 24 hr tablet Duplicate      Current Outpatient Medications:    acetaminophen (TYLENOL) 500 MG tablet, Take 500 mg by mouth every 6 (six) hours as needed for moderate pain., Disp: , Rfl:    dapagliflozin propanediol (FARXIGA) 10 MG TABS tablet, Take 10 mg by mouth daily., Disp: , Rfl:    losartan (COZAAR) 50 MG tablet, Take 1 tablet (50 mg total) by mouth daily., Disp: 90 tablet, Rfl: 3   metoprolol succinate (TOPROL-XL) 50 MG 24 hr tablet, TAKE 1 TABLET BY MOUTH EACH MORNING WITHOR IMMEDIATELY FOLLOWING A MEAL, Disp: 30 tablet, Rfl: 0   omeprazole (PRILOSEC) 20 MG capsule, Take 20 mg by mouth daily as needed (heart burn)., Disp: , Rfl:    spironolactone (ALDACTONE) 25 MG tablet, Take 1 tablet (25 mg total) by mouth daily., Disp: 90 tablet, Rfl: 3  No orders of the defined types were placed in this encounter.   There are no Patient Instructions on file for this visit.   --Continue cardiac medications as reconciled in final medication list. --Return in about 6 months (around 03/02/2022) for Follow up, heart failure management.. Or sooner if needed. --Continue follow-up with your primary care physician regarding the management of your other chronic comorbid conditions.  Patient's questions and concerns were addressed to her satisfaction. She voices understanding of the instructions provided during this encounter.   This note was created using a voice recognition software as a result there may be grammatical errors inadvertently enclosed that do not reflect the nature of this encounter. Every attempt is made to correct such errors.  Rex Kras, [place], [facility]  Pager: [phone] Office: [phone]

## 2021-10-06 ENCOUNTER — Telehealth: Payer: Self-pay | Admitting: Cardiology

## 2021-10-07 NOTE — Telephone Encounter (Signed)
Patient has been on [place] for some time. However, recently has been having recurrent urinary tract infections and despite treatment complains of dysuria and urinary urgency.  Patient requesting to hold/stop the medication.  Agree. Have asked her to be more cognizant of her weight and if she notices more than 1 pound over 24 hours or 3 pounds over the course of a week to call us and we can add diuretic.  She already uses Lasix on as needed basis.  Tija Biss [place], DO, [facility]

## 2021-11-03 ENCOUNTER — Other Ambulatory Visit: Payer: Self-pay | Admitting: Cardiology

## 2021-12-27 ENCOUNTER — Encounter: Payer: Self-pay | Admitting: Cardiology

## 2021-12-27 ENCOUNTER — Ambulatory Visit (INDEPENDENT_AMBULATORY_CARE_PROVIDER_SITE_OTHER): Payer: PRIVATE HEALTH INSURANCE | Admitting: Cardiology

## 2021-12-27 VITALS — BP 131/82 | HR 78 | Ht 61.0 in | Wt 211.8 lb

## 2021-12-27 DIAGNOSIS — I5042 Chronic combined systolic (congestive) and diastolic (congestive) heart failure: Secondary | ICD-10-CM

## 2021-12-27 DIAGNOSIS — I447 Left bundle-branch block, unspecified: Secondary | ICD-10-CM

## 2021-12-27 DIAGNOSIS — Z8616 Personal history of COVID-19: Secondary | ICD-10-CM

## 2021-12-27 DIAGNOSIS — I428 Other cardiomyopathies: Secondary | ICD-10-CM

## 2021-12-27 MED ORDER — TORSEMIDE 10 MG PO TABS: 10.0000 mg | ORAL_TABLET | Freq: Every morning | ORAL | 0 refills | Status: DC

## 2021-12-27 NOTE — Progress Notes (Signed)
 ID:  Marie Porter, DOB 09/25/1960, MRN [id number]  PCP:  Blevins, Andrea, NP  Cardiologist: Sunit Tolia, DO, FACC (established care 06/07/2020)  Date: 12/27/21 Last Office Visit: 08/30/2021  Chief Complaint  Patient presents with   Follow-up    Fluid and weight gain    Congestive Heart Failure   Cardiomyopathy    HPI  Marie Porter is a 61 y.o. female whose past medical history and cardiovascular risk factors include: Hx of COVID 19 infection (May 2021 and January 2022), nonischemic cardiomyopathy, chronic HFrEF, history of glaucoma, hypertension, obesity due to excess calories.  Patient has known history of nonischemic cardiomyopathy and has been on guideline directed medical therapy with few changes in medications as they were too expensive.  She now presents for sick visit due to worsening shortness of breath and weight gain.  Since last office visit in July 2023 patient has gained approximately 11 pounds.  She recently stopped Farxiga due to urinary tract infection.  He is currently on Lasix 40 mg p.o. every morning and her weight is reduced by 3 pounds per patient.  She denies angina pectoris at rest or with effort related activities.  She walks 1 mile every other day.  Has been evaluated by cardiac electrophysiologist Dr. Taylor for possible BiV ICD; however, patient still has not made her decision with regards to device therapy.  ALLERGIES: No Known Allergies  MEDICATION LIST PRIOR TO VISIT: Current Meds  Medication Sig   acetaminophen (TYLENOL) 500 MG tablet Take 500 mg by mouth every 6 (six) hours as needed for moderate pain.   losartan (COZAAR) 50 MG tablet Take 1 tablet (50 mg total) by mouth daily.   metoprolol succinate (TOPROL-XL) 50 MG 24 hr tablet TAKE 1 TABLET BY MOUTH EACH MORNING WITHOR IMMEDIATELY FOLLOWING A MEAL   omeprazole (PRILOSEC) 20 MG capsule Take 20 mg by mouth daily as needed (heart burn).   spironolactone (ALDACTONE) 25 MG tablet  Take 1 tablet (25 mg total) by mouth daily.   torsemide (DEMADEX) 10 MG tablet Take 1 tablet (10 mg total) by mouth every morning.   [DISCONTINUED] furosemide (LASIX) 20 MG tablet Take 20-40 mg by mouth daily as needed.     PAST MEDICAL HISTORY: Past Medical History:  Diagnosis Date   Asthma    Cardiomyopathy (HCC)    CHF (congestive heart failure) (HCC)    Glaucoma    History of COVID-19    Hypertension    Migraine     PAST SURGICAL HISTORY: Past Surgical History:  Procedure Laterality Date   EYE SURGERY     glaucoma-multiple in childhood   GANGLION CYST EXCISION Left 10/12/2011   FOOT   RIGHT/LEFT HEART CATH AND CORONARY ANGIOGRAPHY N/A 06/18/2020   Procedure: RIGHT/LEFT HEART CATH AND CORONARY ANGIOGRAPHY;  Surgeon: Patwardhan, Manish J, MD;  Location: [facility];  Service: Cardiovascular;  Laterality: N/A;    FAMILY HISTORY: The patient family history includes Bladder Cancer (age of onset: 52) in her brother; Cancer in her father; Diabetes in her maternal grandfather; Heart disease in her paternal grandmother; Hypertension in her maternal grandmother and mother; Pneumonia in her paternal grandfather; Seizures in her mother; Seizures (age of onset: 58) in her father; Stroke in her maternal grandmother and mother; Ulcers in her mother.  SOCIAL HISTORY:  The patient  reports that she has never smoked. She has never used smokeless tobacco. She reports that she does not currently use alcohol after a past usage of about   1.0 standard drink of alcohol per week. She reports that she does not use drugs.  REVIEW OF SYSTEMS: Review of Systems  Constitutional: Positive for weight gain.  Cardiovascular:  Positive for dyspnea on exertion, orthopnea and paroxysmal nocturnal dyspnea. Negative for chest pain, leg swelling, palpitations and syncope.  Respiratory:  Positive for shortness of breath.     PHYSICAL EXAM:    12/27/2021    3:18 PM 08/30/2021   12:01 PM 07/04/2021    2:14  PM  Vitals with BMI  Height 5' 1" 5' 1"   Weight 211 lbs 13 oz 200 lbs   BMI 40.04 37.81   Systolic 131 110 140  Diastolic 82 71 93  Pulse 78 68 104    CONSTITUTIONAL: Well-developed and well-nourished. No acute distress.  SKIN: Skin is warm and dry. No rash noted. No cyanosis. No pallor. No jaundice HEAD: Normocephalic and atraumatic.  EYES: No scleral icterus MOUTH/THROAT: Moist oral membranes.  NECK: No JVD present. No thyromegaly noted. No carotid bruits  CHEST Normal respiratory effort. No intercostal retractions  LUNGS: Clear to auscultation bilaterally.  No stridor. No wheezes. No rales.  CARDIOVASCULAR: Regular rate and rhythm, positive S1-S2, holosystolic murmur heard at the apex, without any gallops or rubs. ABDOMINAL: Obese, soft, nontender, nondistended, positive bowel sounds in all 4 quadrants.  No apparent ascites.  EXTREMITIES: +1 bilateral peripheral edema, 2+ dorsalis pedis and posterior tibial pulses bilaterally. HEMATOLOGIC: No significant bruising NEUROLOGIC: Oriented to person, place, and time. Nonfocal. Normal muscle tone.  PSYCHIATRIC: Normal mood and affect. Normal behavior. Cooperative  CARDIAC DATABASE: EKG: 12/27/2021: Sinus rhythm, 60 bpm, left axis, left anterior fascicular block, LBBB.  Echocardiogram: 08/15/2021:  Left ventricle cavity is severely dilated. Normal left ventricular wall thickness. Abnormal septal wall motion due to LBBB. Severe global hypokinesis. LVEF <20%. Doppler evidence of grade I (impaired) diastolic dysfunction, normal LAP.  Left atrial cavity is mildly dilated.  Mild (Grade I) mitral regurgitation.  Mild tricuspid regurgitation.  No evidence of pulmonary hypertension.  Compared to previous study on 10/29/2020, pericardial effusion not appreciated. No other significant change noted.    Stress Testing: No results found for this or any previous visit from the past 1095 days.  Right and Left Heart Cath:  06/18/2020:  No coronary  artery disease Normal filling pressures Compensated nonischemic cardiomyopathy  LABORATORY DATA:    Latest Ref Rng & Units 06/18/2020    1:55 PM 12/15/2018    3:57 PM 02/23/2013   12:30 PM  CBC  WBC 4.0 - 10.5 K/uL  7.1  9.2   Hemoglobin 12.0 - 15.0 g/dL 13.6  13.7  14.6   Hematocrit 36.0 - 46.0 % 40.0  41.2  45.3   Platelets 150 - 400 K/uL  318         Latest Ref Rng & Units 06/18/2020    1:55 PM 12/15/2018    3:57 PM 12/31/2012    8:23 AM  CMP  Glucose 70 - 99 mg/dL  92  82   BUN 6 - 20 mg/dL  18  10   Creatinine 0.44 - 1.00 mg/dL  1.03  0.68   Sodium 135 - 145 mmol/L 143  141  141   Potassium 3.5 - 5.1 mmol/L 3.6  4.0  4.1   Chloride 98 - 111 mmol/L  109  105   CO2 22 - 32 mmol/L  22  26   Calcium 8.9 - 10.3 mg/dL  9.5  8.9   Total Protein 6.0 -   8.3 g/dL   6.5   Total Bilirubin 0.3 - 1.2 mg/dL   0.5   Alkaline Phos 39 - 117 U/L   69   AST 0 - 37 U/L   15   ALT 0 - 35 U/L   14     Lipid Panel     Component Value Date/Time   CHOL 128 12/31/2012 0823   TRIG 56 12/31/2012 0823   HDL 50 12/31/2012 0823   CHOLHDL 2.6 12/31/2012 0823   VLDL 11 12/31/2012 0823   LDLCALC 67 12/31/2012 0823    No components found for: "NTPROBNP" No results for input(s): "PROBNP" in the last 8760 hours. No results for input(s): "TSH" in the last 8760 hours.  BMP No results for input(s): "NA", "K", "CL", "CO2", "GLUCOSE", "BUN", "CREATININE", "CALCIUM", "GFRNONAA", "GFRAA" in the last 8760 hours.   HEMOGLOBIN A1C No results found for: "HGBA1C", "MPG"   External Labs:  Date Collected: 05/03/2020 , information obtained by [facility] Potassium: 4.7 Creatinine 0.95 mg/dL. eGFR: 69 mL/min per 1.73 m Hematocrit: 43.1 % Lipid profile: Total cholesterol 186 , triglycerides 66 , HDL 64 , LDL 110 AST: 22 , ALT: 32 , alkaline phosphatase: 97 BNP 691 (761 04/27/2020) TSH: 2.220  External labs: 06/03/2020: Records provided by the patient. Sodium 144, potassium 4.6, chloride 104, bicarb 22,  creatinine 0.84, BUN 19, BNP 413  06/16/2020: Sodium 146, potassium 5.1, chloride 107, bicarb 23, BUN 17, creatinine 0.82, BNP 438 magnesium 2.1  External Labs: Collected: 07/22/2020 performed at her PCPs office. Creatinine 0.81 mg/dL. eGFR: 84 mL/min per 1.73 m NT proBNP 1495 Sodium 144 potassium 4.9, chloride 106, bicarb 22  Collected: 07/29/2020. Sodium 141, potassium 4.7, chloride 110, bicarb 17, BUN 16, creatinine 0.78, NT proBNP 1118. Magnesium 2.1  External Labs: Collected: Jul 04, 2021 BUN 24, creatinine 1.08. eGFR 59. Sodium 141, potassium 5, chloride 105, bicarb 19 BNP 75  Collected: Jul 11, 2021 Sodium 142, potassium 4.8, chloride 106, bicarb 22. BUN 18, creatinine 0.87   Medical assistants: Please inform the patient that kidney function and electrolytes are stable after transitioning to losartan and staying on spironolactone.   IMPRESSION:    ICD-10-CM   1. Nonischemic cardiomyopathy (HCC)  I42.8 EKG 12-Lead    torsemide (DEMADEX) 10 MG tablet    Basic metabolic panel    Magnesium    Pro b natriuretic peptide (BNP)    Pro b natriuretic peptide (BNP)    Magnesium    Basic metabolic panel    2. Chronic combined systolic and diastolic heart failure (HCC)  I50.42 torsemide (DEMADEX) 10 MG tablet    Basic metabolic panel    Magnesium    Pro b natriuretic peptide (BNP)    Pro b natriuretic peptide (BNP)    Magnesium    Basic metabolic panel    3. LBBB (left bundle branch block)  I44.7     4. History of COVID-19  Z86.16         RECOMMENDATIONS: JUNELLA DOMKE is a 61 y.o. female whose past medical history and cardiac risk factors include: Hx of COVID 55 infection (May 2021 and January 2022), chronic congestive heart failure, cardiomyopathy, history of glaucoma, hypertension, obesity due to excess calories.  Nonischemic cardiomyopathy (HCC) Stage B, NYHA class III. Has undergone echocardiogram and heart catheterization. We have discussed undergoing  cardiac MRI in the past to evaluate for other causes of nonischemic cardiomyopathy.  However it is cost prohibitive per patient. She is also been evaluated by  cardiac electrophysiology for BiV ICD -patient still has not made a definitive decision. Delene Loll is cost preventive. Wilder Glade discontinued secondary to urinary tract infections. Start torsemide 10 mg p.o. daily.   Labs in 1 week to evaluate kidney function  Chronic combined systolic and diastolic heart failure ([place]). Stage B, NYHA class III Delene Loll has become cost prohibitive and she is transition to losartan. Wilder Glade discontinued due to urinary tract infection Has tolerated  spironolactone well. Start torsemide 10 mg p.o. daily with labs in 1 week. Repeat echocardiogram notes LVEF of <20%. Patient is still considering undergoing BiV ICD.  She plans to follow-up with Dr. Lovena Le when she is ready and/or has additional questions or concerns.  LBBB (left bundle branch block) Chronic and stable  History of COVID-19 Patient has had Covid in May 2021 as well as January 2022. Patient was against the COVID-19 vaccination.  FINAL MEDICATION LIST END OF ENCOUNTER: Meds ordered this encounter  Medications   torsemide (DEMADEX) 10 MG tablet    Sig: Take 1 tablet (10 mg total) by mouth every morning.    Dispense:  90 tablet    Refill:  0    Medications Discontinued During This Encounter  Medication Reason   dapagliflozin propanediol (FARXIGA) 10 MG TABS tablet Discontinued by provider   furosemide (LASIX) 20 MG tablet Change in therapy      Current Outpatient Medications:    acetaminophen (TYLENOL) 500 MG tablet, Take 500 mg by mouth every 6 (six) hours as needed for moderate pain., Disp: , Rfl:    losartan (COZAAR) 50 MG tablet, Take 1 tablet (50 mg total) by mouth daily., Disp: 90 tablet, Rfl: 3   metoprolol succinate (TOPROL-XL) 50 MG 24 hr tablet, TAKE 1 TABLET BY MOUTH EACH MORNING WITHOR IMMEDIATELY FOLLOWING A MEAL, Disp: 30  tablet, Rfl: 0   omeprazole (PRILOSEC) 20 MG capsule, Take 20 mg by mouth daily as needed (heart burn)., Disp: , Rfl:    spironolactone (ALDACTONE) 25 MG tablet, Take 1 tablet (25 mg total) by mouth daily., Disp: 90 tablet, Rfl: 3   torsemide (DEMADEX) 10 MG tablet, Take 1 tablet (10 mg total) by mouth every morning., Disp: 90 tablet, Rfl: 0  Orders Placed This Encounter  Procedures   Basic metabolic panel   Magnesium   Pro b natriuretic peptide (BNP)   EKG 12-Lead   There are no Patient Instructions on file for this visit.  --Continue cardiac medications as reconciled in final medication list. --Return in about 4 weeks (around 01/24/2022) for heart failure management.. Or sooner if needed. --Continue follow-up with your primary care physician regarding the management of your other chronic comorbid conditions.  Patient's questions and concerns were addressed to her satisfaction. She voices understanding of the instructions provided during this encounter.   This note was created using a voice recognition software as a result there may be grammatical errors inadvertently enclosed that do not reflect the nature of this encounter. Every attempt is made to correct such errors.  Rex Kras, [place], [facility]  Pager: [phone] Office: [phone]

## 2022-01-05 ENCOUNTER — Telehealth: Payer: Self-pay

## 2022-01-05 NOTE — Telephone Encounter (Signed)
Patient calling with concerns about lab results. She said that her labs revealed that she is dehydrated, and she wanted to know if she could cut the toresmide down to 10 mg every other day. After consulting with Nori Riis, NP, she said that it would be fine to cut it back to 10 mg every other day for a week and then go back up to 10 mg/day on top of maintaining gentle hydration. Patient acknowledged and agreed with the plan.

## 2022-01-20 ENCOUNTER — Encounter: Payer: Self-pay | Admitting: Cardiology

## 2022-01-20 ENCOUNTER — Telehealth: Payer: Self-pay

## 2022-01-20 NOTE — Telephone Encounter (Signed)
Pt is feeling better, she is taking lasix 1/2 every other day.

## 2022-01-20 NOTE — Telephone Encounter (Signed)
-----  Message from Sunit Tolia, DO sent at 01/19/2022  8:34 PM EST ----- External Labs: Collected: 01/04/2022 BUN 28, creatinine 1.1. eGFR 57. Sodium 140, potassium 4.3, chloride 104, bicarb 21. NT-proBNP 413 Magnesium 1.9  Please asked the patient how she is feeling with regards to shortness of breath after initiation of new medications.  Sunit Tolia, DO, FACC 

## 2022-01-20 NOTE — Telephone Encounter (Signed)
Tried to call patient no answer her VM is not setup.

## 2022-01-23 NOTE — Telephone Encounter (Signed)
I have her on torsemide not lasix. Please confirm.   ST

## 2022-01-23 NOTE — Telephone Encounter (Signed)
My error correct torsemide 1/2 tab every other day

## 2022-02-02 ENCOUNTER — Encounter: Payer: Self-pay | Admitting: Cardiology

## 2022-02-02 ENCOUNTER — Ambulatory Visit: Payer: PRIVATE HEALTH INSURANCE | Admitting: Cardiology

## 2022-02-02 VITALS — BP 125/83 | HR 62 | Resp 16 | Ht 61.0 in | Wt 212.8 lb

## 2022-02-02 DIAGNOSIS — I447 Left bundle-branch block, unspecified: Secondary | ICD-10-CM

## 2022-02-02 DIAGNOSIS — I428 Other cardiomyopathies: Secondary | ICD-10-CM

## 2022-02-02 DIAGNOSIS — I5042 Chronic combined systolic (congestive) and diastolic (congestive) heart failure: Secondary | ICD-10-CM

## 2022-02-02 NOTE — Progress Notes (Signed)
ID:  Marie Porter, DOB 16-Apr-1960, MRN [id number]  PCP:  Ferd Hibbs, NP  Cardiologist: Rex Kras, DO, [facility] (established care 06/07/2020)  Date: 02/02/22 Last Office Visit: 12/27/2021  Chief Complaint  Patient presents with   Congestive Heart Failure   Follow-up    4 week    HPI  Marie Porter is a 61 y.o. female whose past medical history and cardiovascular risk factors include: Hx of COVID 19 infection (May 2021 and January 2022), nonischemic cardiomyopathy, chronic HFrEF, history of glaucoma, hypertension, obesity due to excess calories.  Known history of nonischemic cardiomyopathy who prefers to be treated medically.  She has been evaluated by EP for BiV ICD but chooses to hold off for now.  At last office visit in November 2023 she came in for an acute visit due to shortness of breath and increased weight gain.  She was started on torsemide which helped to diurese her well and she is not taking it every other day.  Follow-up labs independently reviewed which notes stable renal function and electrolytes.  She denies anginal discomfort or heart failure symptoms.  She tries to ambulate 1 mile every other day.  ALLERGIES: No Known Allergies  MEDICATION LIST PRIOR TO VISIT: Current Meds  Medication Sig   acetaminophen (TYLENOL) 500 MG tablet Take 500 mg by mouth every 6 (six) hours as needed for moderate pain.   losartan (COZAAR) 50 MG tablet Take 1 tablet (50 mg total) by mouth daily.   metoprolol succinate (TOPROL-XL) 50 MG 24 hr tablet TAKE 1 TABLET BY MOUTH EACH MORNING WITHOR IMMEDIATELY FOLLOWING A MEAL   omeprazole (PRILOSEC) 20 MG capsule Take 20 mg by mouth daily as needed (heart burn).   spironolactone (ALDACTONE) 25 MG tablet Take 1 tablet (25 mg total) by mouth daily.   torsemide (DEMADEX) 10 MG tablet Take 1 tablet (10 mg total) by mouth every morning.     PAST MEDICAL HISTORY: Past Medical History:  Diagnosis Date   Asthma    Cardiomyopathy  ([place])    CHF (congestive heart failure) ([place])    Glaucoma    History of COVID-19    Hypertension    Migraine     PAST SURGICAL HISTORY: Past Surgical History:  Procedure Laterality Date   EYE SURGERY     glaucoma-multiple in childhood   GANGLION CYST EXCISION Left 10/12/2011   FOOT   RIGHT/LEFT HEART CATH AND CORONARY ANGIOGRAPHY N/A 06/18/2020   Procedure: RIGHT/LEFT HEART CATH AND CORONARY ANGIOGRAPHY;  Surgeon: Nigel Mormon, MD;  Location: [facility];  Service: Cardiovascular;  Laterality: N/A;    FAMILY HISTORY: The patient family history includes Bladder Cancer (age of onset: 17) in her brother; Cancer in her father; Diabetes in her maternal grandfather; Heart disease in her paternal grandmother; Hypertension in her maternal grandmother and mother; Pneumonia in her paternal grandfather; Seizures in her mother; Seizures (age of onset: 2) in her father; Stroke in her maternal grandmother and mother; Ulcers in her mother.  SOCIAL HISTORY:  The patient  reports that she has never smoked. She has never used smokeless tobacco. She reports that she does not currently use alcohol after a past usage of about 1.0 standard drink of alcohol per week. She reports that she does not use drugs.  REVIEW OF SYSTEMS: Review of Systems  Cardiovascular:  Positive for dyspnea on exertion (At baseline). Negative for chest pain, leg swelling, orthopnea, palpitations, paroxysmal nocturnal dyspnea and syncope.  Respiratory:  Positive for  shortness of breath (At baseline).     PHYSICAL EXAM:    02/02/2022    3:44 PM 12/27/2021    3:18 PM 08/30/2021   12:01 PM  Vitals with BMI  Height _0  _1  _2   Weight 212 lbs 13 oz 211 lbs 13 oz 200 lbs  BMI 40.23 40.97 35.32  Systolic 992 426 834  Diastolic 83 82 71  Pulse 62 78 68    CONSTITUTIONAL: Well-developed and well-nourished. No acute distress.  SKIN: Skin is warm and dry. No rash noted. No cyanosis. No pallor. No  jaundice HEAD: Normocephalic and atraumatic.  EYES: No scleral icterus MOUTH/THROAT: Moist oral membranes.  NECK: No JVD present. No thyromegaly noted. No carotid bruits  CHEST Normal respiratory effort. No intercostal retractions  LUNGS: Clear to auscultation bilaterally.  No stridor. No wheezes. No rales.  CARDIOVASCULAR: Regular rate and rhythm, positive H9-Q2, holosystolic murmur heard at the apex, without any gallops or rubs. ABDOMINAL: Obese, soft, nontender, nondistended, positive bowel sounds in all 4 quadrants.  No apparent ascites.  EXTREMITIES: +trace bilateral peripheral edema, 2+ dorsalis pedis and posterior tibial pulses bilaterally. HEMATOLOGIC: No significant bruising NEUROLOGIC: Oriented to person, place, and time. Nonfocal. Normal muscle tone.  PSYCHIATRIC: Normal mood and affect. Normal behavior. Cooperative  CARDIAC DATABASE: EKG: 12/27/2021: Sinus rhythm, 60 bpm, left axis, left anterior fascicular block, LBBB.  Echocardiogram: 08/15/2021:  Left ventricle cavity is severely dilated. Normal left ventricular wall thickness. Abnormal septal wall motion due to LBBB. Severe global hypokinesis. LVEF <20%. Doppler evidence of grade I (impaired) diastolic dysfunction, normal LAP.  Left atrial cavity is mildly dilated.  Mild (Grade I) mitral regurgitation.  Mild tricuspid regurgitation.  No evidence of pulmonary hypertension.  Compared to previous study on 10/29/2020, pericardial effusion not appreciated. No other significant change noted.    Stress Testing: No results found for this or any previous visit from the past 1095 days.  Right and Left Heart Cath:  06/18/2020:  No coronary artery disease Normal filling pressures Compensated nonischemic cardiomyopathy  LABORATORY DATA:    Latest Ref Rng & Units 06/18/2020    1:55 PM 12/15/2018    3:57 PM 02/23/2013   12:30 PM  CBC  WBC 4.0 - 10.5 K/uL  7.1  9.2   Hemoglobin 12.0 - 15.0 g/dL 13.6  13.7  14.6   Hematocrit 36.0  - 46.0 % 40.0  41.2  45.3   Platelets 150 - 400 K/uL  318         Latest Ref Rng & Units 06/18/2020    1:55 PM 12/15/2018    3:57 PM 12/31/2012    8:23 AM  CMP  Glucose 70 - 99 mg/dL  92  82   BUN 6 - 20 mg/dL  18  10   Creatinine 0.44 - 1.00 mg/dL  1.03  0.68   Sodium 135 - 145 mmol/L 143  141  141   Potassium 3.5 - 5.1 mmol/L 3.6  4.0  4.1   Chloride 98 - 111 mmol/L  109  105   CO2 22 - 32 mmol/L  22  26   Calcium 8.9 - 10.3 mg/dL  9.5  8.9   Total Protein 6.0 - 8.3 g/dL   6.5   Total Bilirubin 0.3 - 1.2 mg/dL   0.5   Alkaline Phos 39 - 117 U/L   69   AST 0 - 37 U/L   15   ALT 0 - 35 U/L   14  Lipid Panel     Component Value Date/Time   CHOL 128 12/31/2012 0823   TRIG 56 12/31/2012 0823   HDL 50 12/31/2012 0823   CHOLHDL 2.6 12/31/2012 0823   VLDL 11 12/31/2012 0823   LDLCALC 67 12/31/2012 0823    No components found for: "NTPROBNP" No results for input(s): "PROBNP" in the last 8760 hours. No results for input(s): "TSH" in the last 8760 hours.  BMP No results for input(s): "NA", "K", "CL", "CO2", "GLUCOSE", "BUN", "CREATININE", "CALCIUM", "GFRNONAA", "GFRAA" in the last 8760 hours.   HEMOGLOBIN A1C No results found for: "HGBA1C", "MPG"   External Labs:  Date Collected: 05/03/2020 , information obtained by [facility] Potassium: 4.7 Creatinine 0.95 mg/dL. eGFR: 69 mL/min per 1.73 m Hematocrit: 43.1 % Lipid profile: Total cholesterol 186 , triglycerides 66 , HDL 64 , LDL 110 AST: 22 , ALT: 32 , alkaline phosphatase: 97 BNP 691 (761 04/27/2020) TSH: 2.220  External labs: 06/03/2020: Records provided by the patient. Sodium 144, potassium 4.6, chloride 104, bicarb 22, creatinine 0.84, BUN 19, BNP 413  06/16/2020: Sodium 146, potassium 5.1, chloride 107, bicarb 23, BUN 17, creatinine 0.82, BNP 438 magnesium 2.1  External Labs: Collected: 07/22/2020 performed at her PCPs office. Creatinine 0.81 mg/dL. eGFR: 84 mL/min per 1.73 m NT proBNP 1495 Sodium 144  potassium 4.9, chloride 106, bicarb 22  Collected: 07/29/2020. Sodium 141, potassium 4.7, chloride 110, bicarb 17, BUN 16, creatinine 0.78, NT proBNP 1118. Magnesium 2.1  External Labs: Collected: Jul 04, 2021 BUN 24, creatinine 1.08. eGFR 59. Sodium 141, potassium 5, chloride 105, bicarb 19 BNP 75  Collected: Jul 11, 2021 Sodium 142, potassium 4.8, chloride 106, bicarb 22. BUN 18, creatinine 0.87   External Labs: Collected: 01/04/2022 BUN 28, creatinine 1.1. eGFR 57. Sodium 140, potassium 4.3, chloride 104, bicarb 21. NT-proBNP 413 Magnesium 1.9   IMPRESSION:    ICD-10-CM   1. Nonischemic cardiomyopathy (HCC)  I42.8     2. Chronic combined systolic and diastolic heart failure (HCC)  I50.42     3. LBBB (left bundle branch block)  I44.7         RECOMMENDATIONS: Marie Porter is a 61 y.o. female whose past medical history and cardiac risk factors include: Hx of COVID 11 infection (May 2021 and January 2022), chronic congestive heart failure, cardiomyopathy, history of glaucoma, hypertension, obesity due to excess calories.  Nonischemic cardiomyopathy / Chronic combined systolic and diastolic heart failure ([place]). Stage B, NYHA class II. Has undergone echocardiogram and heart catheterization. We have discussed undergoing cardiac MRI in the past to evaluate for other causes of nonischemic cardiomyopathy.  However it is cost prohibitive per patient. Has seen Dr. Lovena Le from [facility] and is an appropriate candidate for BiV-ICD but for reasons unknown she would like to hold off.  Today I even recommended seeing advance heart failure team in consultation to see if there is anything else they could offer to further optimize her given her age and functional member of society. However, she is not too inclined.  Delene Loll is cost prohibitive. Wilder Glade discontinued secondary to urinary tract infections. Currently taking torsemide 10 mg every other day, recommend taking  it every day  Outside labs independently reviewed and noted above.  LBBB (left bundle branch block) Chronic and stable  History of COVID-19 Patient has had Covid in May 2021 as well as January 2022. Patient was against the COVID-19 vaccination.  FINAL MEDICATION LIST END OF ENCOUNTER: No orders of the defined types were  placed in this encounter.   There are no discontinued medications.    Current Outpatient Medications:    acetaminophen (TYLENOL) 500 MG tablet, Take 500 mg by mouth every 6 (six) hours as needed for moderate pain., Disp: , Rfl:    losartan (COZAAR) 50 MG tablet, Take 1 tablet (50 mg total) by mouth daily., Disp: 90 tablet, Rfl: 3   metoprolol succinate (TOPROL-XL) 50 MG 24 hr tablet, TAKE 1 TABLET BY MOUTH EACH MORNING WITHOR IMMEDIATELY FOLLOWING A MEAL, Disp: 30 tablet, Rfl: 0   omeprazole (PRILOSEC) 20 MG capsule, Take 20 mg by mouth daily as needed (heart burn)., Disp: , Rfl:    spironolactone (ALDACTONE) 25 MG tablet, Take 1 tablet (25 mg total) by mouth daily., Disp: 90 tablet, Rfl: 3   torsemide (DEMADEX) 10 MG tablet, Take 1 tablet (10 mg total) by mouth every morning., Disp: 90 tablet, Rfl: 0  No orders of the defined types were placed in this encounter.  There are no Patient Instructions on file for this visit.  --Continue cardiac medications as reconciled in final medication list. --Return in about 6 months (around 08/04/2022) for Follow up, heart failure management.. Or sooner if needed. --Continue follow-up with your primary care physician regarding the management of your other chronic comorbid conditions.  Patient's questions and concerns were addressed to her satisfaction. She voices understanding of the instructions provided during this encounter.   This note was created using a voice recognition software as a result there may be grammatical errors inadvertently enclosed that do not reflect the nature of this encounter. Every attempt is made to correct  such errors.  Rex Kras, [place], [facility]  Pager: [phone] Office: [phone]

## 2022-03-02 ENCOUNTER — Ambulatory Visit: Payer: PRIVATE HEALTH INSURANCE | Admitting: Cardiology

## 2022-04-10 ENCOUNTER — Other Ambulatory Visit: Payer: Self-pay | Admitting: Cardiology

## 2022-04-10 DIAGNOSIS — I5042 Chronic combined systolic (congestive) and diastolic (congestive) heart failure: Secondary | ICD-10-CM

## 2022-04-10 DIAGNOSIS — I428 Other cardiomyopathies: Secondary | ICD-10-CM

## 2022-07-11 ENCOUNTER — Other Ambulatory Visit: Payer: Self-pay | Admitting: Cardiology

## 2022-07-11 ENCOUNTER — Other Ambulatory Visit: Payer: Self-pay

## 2022-07-11 DIAGNOSIS — I5042 Chronic combined systolic (congestive) and diastolic (congestive) heart failure: Secondary | ICD-10-CM

## 2022-07-11 DIAGNOSIS — I428 Other cardiomyopathies: Secondary | ICD-10-CM

## 2022-07-11 MED ORDER — METOPROLOL SUCCINATE ER 50 MG PO TB24: ORAL_TABLET | ORAL | 3 refills | Status: DC

## 2022-07-11 MED ORDER — LOSARTAN POTASSIUM 50 MG PO TABS: 50.0000 mg | ORAL_TABLET | Freq: Every day | ORAL | 3 refills | Status: DC

## 2022-07-11 MED ORDER — TORSEMIDE 20 MG PO TABS: 10.0000 mg | ORAL_TABLET | Freq: Every morning | ORAL | 0 refills | Status: DC

## 2022-08-01 ENCOUNTER — Encounter: Payer: Self-pay | Admitting: Cardiology

## 2022-08-02 NOTE — Progress Notes (Signed)
Called and spoke with patient regarding her recent lab results.

## 2022-08-04 ENCOUNTER — Ambulatory Visit: Payer: PRIVATE HEALTH INSURANCE | Admitting: Cardiology

## 2022-08-14 ENCOUNTER — Encounter: Payer: Self-pay | Admitting: Cardiology

## 2022-08-14 ENCOUNTER — Ambulatory Visit: Payer: PRIVATE HEALTH INSURANCE | Admitting: Cardiology

## 2022-08-14 VITALS — BP 122/78 | HR 75 | Ht 61.0 in | Wt 200.0 lb

## 2022-08-14 DIAGNOSIS — I428 Other cardiomyopathies: Secondary | ICD-10-CM

## 2022-08-14 DIAGNOSIS — I447 Left bundle-branch block, unspecified: Secondary | ICD-10-CM

## 2022-08-14 DIAGNOSIS — Z8616 Personal history of COVID-19: Secondary | ICD-10-CM

## 2022-08-14 DIAGNOSIS — I5042 Chronic combined systolic (congestive) and diastolic (congestive) heart failure: Secondary | ICD-10-CM

## 2022-08-14 MED ORDER — TORSEMIDE 10 MG PO TABS
10.0000 mg | ORAL_TABLET | Freq: Every morning | ORAL | 1 refills | Status: DC
Start: 2022-08-14 — End: 2023-04-06

## 2022-08-14 MED ORDER — SPIRONOLACTONE 25 MG PO TABS
25.0000 mg | ORAL_TABLET | Freq: Every day | ORAL | 1 refills | Status: DC
Start: 2022-08-14 — End: 2023-02-19

## 2022-08-14 NOTE — Progress Notes (Unsigned)
ID:  Marie Porter, DOB February 13, 1961, MRN [id number]  PCP:  Lance Bosch, NP  Cardiologist: Tessa Lerner, DO, [facility] (established care 06/07/2020)  Date: 08/17/22 Last Office Visit: 02/02/2022  Chief Complaint  Patient presents with   Nonischemic cardiomyopathy [facility])   Follow-up    HPI  Marie Porter is a 62 y.o. female whose past medical history and cardiovascular risk factors include: Hx of COVID 19 infection (May 2021 and January 2022), nonischemic cardiomyopathy, chronic HFrEF, history of glaucoma, hypertension, obesity due to excess calories.  Known history of nonischemic cardiomyopathy who prefers to be treated medically.  She has been evaluated by EP for BiV ICD but chooses to hold off for now.  She presents today for follow-up.  She denies anginal chest pain or heart failure symptoms.  Initially was walking 1 mile every other day and since last visit she is walking 2 miles per day.  She recently had labs at an outside facility which were independently reviewed and noted below for further reference.  She presents today for 61-month follow-up visit.  She denies anginal chest pain or heart failure symptoms.  She is been compliant with medical therapy.  Overall functional capacity remains stable.  In the past she has refused AICD for primary prevention.  ALLERGIES: No Known Allergies  MEDICATION LIST PRIOR TO VISIT: Current Meds  Medication Sig   acetaminophen (TYLENOL) 500 MG tablet Take 500 mg by mouth every 6 (six) hours as needed for moderate pain.   losartan (COZAAR) 50 MG tablet Take 1 tablet (50 mg total) by mouth daily.   metoprolol succinate (TOPROL-XL) 50 MG 24 hr tablet TAKE 1 TABLET BY MOUTH EACH MORNING WITHOR IMMEDIATELY FOLLOWING A MEAL   omeprazole (PRILOSEC) 20 MG capsule Take 20 mg by mouth daily as needed (heart burn).   [DISCONTINUED] spironolactone (ALDACTONE) 25 MG tablet Take 1 tablet (25 mg total) by mouth daily.   [DISCONTINUED] torsemide  (DEMADEX) 20 MG tablet Take 0.5 tablets (10 mg total) by mouth every morning.     PAST MEDICAL HISTORY: Past Medical History:  Diagnosis Date   Asthma    Cardiomyopathy (HCC)    CHF (congestive heart failure) (HCC)    Glaucoma    History of COVID-19    Hypertension    Migraine     PAST SURGICAL HISTORY: Past Surgical History:  Procedure Laterality Date   EYE SURGERY     glaucoma-multiple in childhood   GANGLION CYST EXCISION Left 10/12/2011   FOOT   RIGHT/LEFT HEART CATH AND CORONARY ANGIOGRAPHY N/A 06/18/2020   Procedure: RIGHT/LEFT HEART CATH AND CORONARY ANGIOGRAPHY;  Surgeon: Elder Negus, MD;  Location: [facility] INVASIVE CV LAB;  Service: Cardiovascular;  Laterality: N/A;    FAMILY HISTORY: The patient family history includes Bladder Cancer (age of onset: 57) in her brother; Cancer in her father; Diabetes in her maternal grandfather; Heart disease in her paternal grandmother; Hypertension in her maternal grandmother and mother; Pneumonia in her paternal grandfather; Seizures in her mother; Seizures (age of onset: 50) in her father; Stroke in her maternal grandmother and mother; Ulcers in her mother.  SOCIAL HISTORY:  The patient  reports that she has never smoked. She has never used smokeless tobacco. She reports that she does not currently use alcohol after a past usage of about 1.0 standard drink of alcohol per week. She reports that she does not use drugs.  REVIEW OF SYSTEMS: Review of Systems  Cardiovascular:  Positive for dyspnea on exertion (At  baseline). Negative for chest pain, leg swelling, orthopnea, palpitations, paroxysmal nocturnal dyspnea and syncope.  Respiratory:  Positive for shortness of breath (At baseline).     PHYSICAL EXAM:    08/14/2022    2:00 PM 02/02/2022    3:44 PM 12/27/2021    3:18 PM  Vitals with BMI  Height 5\' 1"  5\' 1"  5\' 1"   Weight 200 lbs 212 lbs 13 oz 211 lbs 13 oz  BMI 37.81 40.23 40.04  Systolic 122 125 784  Diastolic 78 83 82   Pulse 75 62 78   Physical Exam  Constitutional: No distress.  Age appropriate, hemodynamically stable.   Neck: No JVD present.  Cardiovascular: Normal rate, regular rhythm, S1 normal, S2 normal, intact distal pulses and normal pulses. Exam reveals no gallop, no S3 and no S4.  No murmur heard. Pulmonary/Chest: Effort normal and breath sounds normal. No stridor. She has no wheezes. She has no rales.  Abdominal: Soft. Bowel sounds are normal. She exhibits no distension. There is no abdominal tenderness.  Musculoskeletal:        General: No edema.     Cervical back: Neck supple.  Neurological: She is alert and oriented to person, place, and time. She has intact cranial nerves (2-12).  Skin: Skin is warm and moist.   CARDIAC DATABASE: EKG: 08/14/2022: Sinus rhythm, 61 bpm, left bundle branch block, left axis.  Echocardiogram: 08/15/2021:  Left ventricle cavity is severely dilated. Normal left ventricular wall thickness. Abnormal septal wall motion due to LBBB. Severe global hypokinesis. LVEF <20%. Doppler evidence of grade I (impaired) diastolic dysfunction, normal LAP.  Left atrial cavity is mildly dilated.  Mild (Grade I) mitral regurgitation.  Mild tricuspid regurgitation.  No evidence of pulmonary hypertension.  Compared to previous study on 10/29/2020, pericardial effusion not appreciated. No other significant change noted.    Stress Testing: No results found for this or any previous visit from the past 1095 days.  Right and Left Heart Cath:  06/18/2020:  No coronary artery disease Normal filling pressures Compensated nonischemic cardiomyopathy  LABORATORY DATA:    Latest Ref Rng & Units 06/18/2020    1:55 PM 12/15/2018    3:57 PM 02/23/2013   12:30 PM  CBC  WBC 4.0 - 10.5 K/uL  7.1  9.2   Hemoglobin 12.0 - 15.0 g/dL 69.6  29.5  28.4   Hematocrit 36.0 - 46.0 % 40.0  41.2  45.3   Platelets 150 - 400 K/uL  318         Latest Ref Rng & Units 06/18/2020    1:55 PM 12/15/2018     3:57 PM 12/31/2012    8:23 AM  CMP  Glucose 70 - 99 mg/dL  92  82   BUN 6 - 20 mg/dL  18  10   Creatinine 1.32 - 1.00 mg/dL  4.40  1.02   Sodium 725 - 145 mmol/L 143  141  141   Potassium 3.5 - 5.1 mmol/L 3.6  4.0  4.1   Chloride 98 - 111 mmol/L  109  105   CO2 22 - 32 mmol/L  22  26   Calcium 8.9 - 10.3 mg/dL  9.5  8.9   Total Protein 6.0 - 8.3 g/dL   6.5   Total Bilirubin 0.3 - 1.2 mg/dL   0.5   Alkaline Phos 39 - 117 U/L   69   AST 0 - 37 U/L   15   ALT 0 - 35 U/L  14     Lipid Panel     Component Value Date/Time   CHOL 128 12/31/2012 0823   TRIG 56 12/31/2012 0823   HDL 50 12/31/2012 0823   CHOLHDL 2.6 12/31/2012 0823   VLDL 11 12/31/2012 0823   LDLCALC 67 12/31/2012 0823    No components found for: "NTPROBNP" No results for input(s): "PROBNP" in the last 8760 hours. No results for input(s): "TSH" in the last 8760 hours.  BMP No results for input(s): "NA", "K", "CL", "CO2", "GLUCOSE", "BUN", "CREATININE", "CALCIUM", "GFRNONAA", "GFRAA" in the last 8760 hours.   HEMOGLOBIN A1C No results found for: "HGBA1C", "MPG"   External Labs:  Date Collected: 05/03/2020 , information obtained by [facility] Potassium: 4.7 Creatinine 0.95 mg/dL. eGFR: 69 mL/min per 1.73 m Hematocrit: 43.1 % Lipid profile: Total cholesterol 186 , triglycerides 66 , HDL 64 , LDL 110 AST: 22 , ALT: 32 , alkaline phosphatase: 97 BNP 691 (761 04/27/2020) TSH: 2.220  External labs: 06/03/2020: Records provided by the patient. Sodium 144, potassium 4.6, chloride 104, bicarb 22, creatinine 0.84, BUN 19, BNP 413  06/16/2020: Sodium 146, potassium 5.1, chloride 107, bicarb 23, BUN 17, creatinine 0.82, BNP 438 magnesium 2.1  External Labs: Collected: 07/22/2020 performed at her PCPs office. Creatinine 0.81 mg/dL. eGFR: 84 mL/min per 1.73 m NT proBNP 1495 Sodium 144 potassium 4.9, chloride 106, bicarb 22  Collected: 07/29/2020. Sodium 141, potassium 4.7, chloride 110, bicarb 17, BUN 16,  creatinine 0.78, NT proBNP 1118. Magnesium 2.1  External Labs: Collected: Jul 04, 2021 BUN 24, creatinine 1.08. eGFR 59. Sodium 141, potassium 5, chloride 105, bicarb 19 BNP 75  Collected: Jul 11, 2021 Sodium 142, potassium 4.8, chloride 106, bicarb 22. BUN 18, creatinine 0.87   External Labs: Collected: 01/04/2022 BUN 28, creatinine 1.1. eGFR 57. Sodium 140, potassium 4.3, chloride 104, bicarb 21. NT-proBNP 413 Magnesium 1.9   Collected: November 2023 Creatinine 1.12. eGFR 57. NT-proBNP 413  External Labs: Collected: July 27, 2022 provided by PCP. BUN 21, creatinine 1.2 eGFR 51. Sodium 142, potassium 4.2, chloride 106, bicarb 19 NT proBNP 131 Magnesium 2.2.   IMPRESSION:    ICD-10-CM   1. Chronic combined systolic and diastolic heart failure, NYHA class 2 (HCC)  I50.42 EKG 12-Lead    MR CARDIAC MORPHOLOGY W WO CONTRAST    Ambulatory referral to Cardiology    2. Nonischemic cardiomyopathy (HCC)  I42.8 MR CARDIAC MORPHOLOGY W WO CONTRAST    Ambulatory referral to Cardiology    torsemide (DEMADEX) 10 MG tablet    spironolactone (ALDACTONE) 25 MG tablet    3. LBBB (left bundle branch block)  I44.7     4. History of COVID-19  Z86.16     5. Chronic combined systolic and diastolic heart failure (HCC)  N82.95 torsemide (DEMADEX) 10 MG tablet    6. Chronic combined systolic and diastolic CHF, NYHA class 3 (HCC)  I50.42 spironolactone (ALDACTONE) 25 MG tablet        RECOMMENDATIONS: DARCI LYKINS is a 62 y.o. female whose past medical history and cardiac risk factors include: Hx of COVID 62 infection (May 2021 and January 2022), chronic congestive heart failure, cardiomyopathy, history of glaucoma, hypertension, obesity due to excess calories.  Nonischemic cardiomyopathy Chronic combined systolic and diastolic heart failure (HCC). Stage B, NYHA class II. Likely precipitated by her prior COVID-19 infections. Echo 07/2021: LVEF <20%, grade 1 diastolic  dysfunction, mild MR/TR  Left/right heart catheterization April 2022: No obstructive disease, normal filling pressures, compensated nonischemic cardiomyopathy  Has been evaluated by Dr. Ladona Ridgel for BiV ICD implant-patient has refused in the past. In the past recommended heart failure consultation given her young age and severely reduced LVEF for advanced therapies.  However patient wanted to hold off.  We addressed this at today's office visit and patient is more agreeable and wants to pursue consultation with advanced heart failure to discuss either her options  or follow up w/ them from time to time. Will refer to Dr. Gala Romney. Sherryll Burger is cost prohibitive. Marcelline Deist discontinued secondary to urinary tract infections. Torsemide as spironolactone refilled. Outside labs independently reviewed, NT proBNP trending down. We discussed the role of cardiac MRI and patient is agreeable to proceed forward..   She will need a CBC and a BMP prior to her cardiac MRI. Very well compensated/optimized from a cardiovascular standpoint given the resources available.  LBBB (left bundle branch block) Chronic and stable  History of COVID-19 Patient has had Covid in May 2021 as well as January 2022. Patient was against the COVID-19 vaccination.  FINAL MEDICATION LIST END OF ENCOUNTER: Meds ordered this encounter  Medications   torsemide (DEMADEX) 10 MG tablet    Sig: Take 1 tablet (10 mg total) by mouth every morning.    Dispense:  90 tablet    Refill:  1   spironolactone (ALDACTONE) 25 MG tablet    Sig: Take 1 tablet (25 mg total) by mouth daily.    Dispense:  90 tablet    Refill:  1    Medications Discontinued During This Encounter  Medication Reason   spironolactone (ALDACTONE) 25 MG tablet Reorder   torsemide (DEMADEX) 20 MG tablet Reorder      Current Outpatient Medications:    acetaminophen (TYLENOL) 500 MG tablet, Take 500 mg by mouth every 6 (six) hours as needed for moderate pain., Disp: ,  Rfl:    losartan (COZAAR) 50 MG tablet, Take 1 tablet (50 mg total) by mouth daily., Disp: 30 tablet, Rfl: 3   metoprolol succinate (TOPROL-XL) 50 MG 24 hr tablet, TAKE 1 TABLET BY MOUTH EACH MORNING WITHOR IMMEDIATELY FOLLOWING A MEAL, Disp: 30 tablet, Rfl: 3   omeprazole (PRILOSEC) 20 MG capsule, Take 20 mg by mouth daily as needed (heart burn)., Disp: , Rfl:    spironolactone (ALDACTONE) 25 MG tablet, Take 1 tablet (25 mg total) by mouth daily., Disp: 90 tablet, Rfl: 1   torsemide (DEMADEX) 10 MG tablet, Take 1 tablet (10 mg total) by mouth every morning., Disp: 90 tablet, Rfl: 1  Orders Placed This Encounter  Procedures   MR CARDIAC MORPHOLOGY W WO CONTRAST   Ambulatory referral to Cardiology   EKG 12-Lead    There are no Patient Instructions on file for this visit.  --Continue cardiac medications as reconciled in final medication list. --Return in about 6 months (around 02/13/2023) for Follow up, heart failure management.. Or sooner if needed. --Continue follow-up with your primary care physician regarding the management of your other chronic comorbid conditions.  Patient's questions and concerns were addressed to her satisfaction. She voices understanding of the instructions provided during this encounter.   This note was created using a voice recognition software as a result there may be grammatical errors inadvertently enclosed that do not reflect the nature of this encounter. Every attempt is made to correct such errors.  Tessa Lerner, [place], [facility]  Pager: [phone] Office: [phone]

## 2022-10-04 ENCOUNTER — Telehealth (HOSPITAL_COMMUNITY): Payer: Self-pay | Admitting: Cardiology

## 2022-10-04 NOTE — Telephone Encounter (Signed)
Referring office called to check the status of referral  NP appt made Details provided to pt NP packet mailed

## 2022-11-08 ENCOUNTER — Other Ambulatory Visit: Payer: Self-pay | Admitting: Cardiology

## 2022-11-08 DIAGNOSIS — I5042 Chronic combined systolic (congestive) and diastolic (congestive) heart failure: Secondary | ICD-10-CM

## 2022-11-08 DIAGNOSIS — I428 Other cardiomyopathies: Secondary | ICD-10-CM

## 2022-11-13 ENCOUNTER — Encounter (HOSPITAL_COMMUNITY): Payer: Self-pay

## 2022-11-14 ENCOUNTER — Telehealth: Payer: Self-pay | Admitting: Cardiology

## 2022-11-14 NOTE — Telephone Encounter (Signed)
Spoke to patient, she just wants to know if she should cancel her MRI on Thursday. She would like to hear back tomorrow morning that way she has enough time to cancel.

## 2022-11-14 NOTE — Telephone Encounter (Signed)
Spoke with patient and she states at her last visit provider suggested Cardiac MRI. She states she has [employer]. She will have to pay out of pocket for all her services. She then sends bill to them and they pray over and they may not pay her back. She feels the test will not change her quality of life. She is in heart failure but she does not feel the test will make a difference so its not needed. She would like your opinion

## 2022-11-14 NOTE — Telephone Encounter (Signed)
Pt called in stating she would like to speak to nurse about her MRI 11/16/22.

## 2022-11-15 NOTE — Telephone Encounter (Signed)
Spoke with the patient over the phone.  Her questions and concerns addressed to her satisfaction.  She informs me that she will be getting her cardiac MRI appointment given her severely reduced LVEF per echo.  Gustavus Haskin Revere, DO, [facility]

## 2022-11-16 ENCOUNTER — Other Ambulatory Visit: Payer: Self-pay | Admitting: Cardiology

## 2022-11-16 ENCOUNTER — Ambulatory Visit (HOSPITAL_COMMUNITY)
Admission: RE | Admit: 2022-11-16 | Discharge: 2022-11-16 | Disposition: A | Discharge status: Home / Self Care | Payer: PRIVATE HEALTH INSURANCE | Source: Ambulatory Visit | Attending: Cardiology | Admitting: Cardiology

## 2022-11-16 DIAGNOSIS — I5042 Chronic combined systolic (congestive) and diastolic (congestive) heart failure: Secondary | ICD-10-CM

## 2022-11-16 DIAGNOSIS — I428 Other cardiomyopathies: Secondary | ICD-10-CM | POA: Insufficient documentation

## 2022-11-16 MED ORDER — GADOBUTROL 1 MMOL/ML IV SOLN
10.0000 mL | Freq: Once | INTRAVENOUS | Status: AC | PRN
  Administered 2022-11-16: 10 mL via INTRAVENOUS

## 2022-11-23 ENCOUNTER — Encounter (HOSPITAL_COMMUNITY): Payer: Self-pay | Admitting: Internal Medicine

## 2022-11-23 ENCOUNTER — Ambulatory Visit (HOSPITAL_COMMUNITY)
Admission: RE | Admit: 2022-11-23 | Discharge: 2022-11-23 | Disposition: A | Discharge status: Home / Self Care | Payer: PRIVATE HEALTH INSURANCE | Source: Ambulatory Visit | Attending: Internal Medicine | Admitting: Internal Medicine

## 2022-11-23 VITALS — BP 104/70 | HR 61 | Wt 200.2 lb

## 2022-11-23 DIAGNOSIS — I429 Cardiomyopathy, unspecified: Secondary | ICD-10-CM | POA: Diagnosis not present

## 2022-11-23 DIAGNOSIS — I5022 Chronic systolic (congestive) heart failure: Secondary | ICD-10-CM

## 2022-11-23 DIAGNOSIS — I428 Other cardiomyopathies: Secondary | ICD-10-CM | POA: Diagnosis not present

## 2022-11-23 DIAGNOSIS — Z79899 Other long term (current) drug therapy: Secondary | ICD-10-CM | POA: Insufficient documentation

## 2022-11-23 DIAGNOSIS — I110 Hypertensive heart disease with heart failure: Secondary | ICD-10-CM | POA: Diagnosis present

## 2022-11-23 DIAGNOSIS — N390 Urinary tract infection, site not specified: Secondary | ICD-10-CM | POA: Diagnosis not present

## 2022-11-23 DIAGNOSIS — I5040 Unspecified combined systolic (congestive) and diastolic (congestive) heart failure: Secondary | ICD-10-CM

## 2022-11-23 DIAGNOSIS — I447 Left bundle-branch block, unspecified: Secondary | ICD-10-CM

## 2022-11-23 NOTE — Progress Notes (Signed)
ADVANCED HF CLINIC CONSULT NOTE  Referring Physician: Dr. Odis Hollingshead Primary Care: Lance Bosch, NP Primary Cardiologist: Dr. Odis Hollingshead  HPI:  Marie Porter is a 62 yo female with HTN, LBBB, obesity and chronic systolic HF due to LBBB referred by Dr. Odis Hollingshead for further evaluation of HF.   Diagnosed in 2022. Felt to be related to COVID. Left/right heart catheterization April 2022: No obstructive disease, normal filling pressures, compensated nonischemic cardiomyopathy  Echo 07/2021: LVEF <20%, grade 1 diastolic dysfunction, mild MR/TR   Has been evaluated by Dr. Ladona Ridgel for BiV ICD implant-patient has refused in the past. Has been off [place] due to UTI. Off entresto due to cost.   cMRI 11/16/22: EF 32% + septal dyssynergy No LGE. RVEF normal 51%  Here with her husband (he is a Optician, dispensing who has a Educational psychologist). Feels ok. She is LPN/CMA. Hasn't worked for 18 months. In process of moving to [facility]. Trying to lose weight so she is walking more and praying and fasting for 40 days prior to the election (gets liquid calories from clear liquids). Has lost 18 pounds. Walking at least 10K steps per day. Gets SOB up hills.  Review of Systems: [y] = yes, [ ]  = no   General: Weight gain [ ] ; Weight loss [ ] ; Anorexia [ ] ; Fatigue [ ] ; Fever [ ] ; Chills [ ] ; Weakness [ ]   Cardiac: Chest pain/pressure [ ] ; Resting SOB [ ] ; Exertional SOB [ y]; Orthopnea [ ] ; Pedal Edema [ ] ; Palpitations [ ] ; Syncope [ ] ; Presyncope [ ] ; Paroxysmal nocturnal dyspnea[ ]   Pulmonary: Cough [ ] ; Wheezing[ ] ; Hemoptysis[ ] ; Sputum [ ] ; Snoring [ ]   GI: Vomiting[ ] ; Dysphagia[ ] ; Melena[ ] ; Hematochezia [ ] ; Heartburn[ ] ; Abdominal pain [ ] ; Constipation [ ] ; Diarrhea [ ] ; BRBPR [ ]   GU: Hematuria[ ] ; Dysuria [ ] ; Nocturia[ ]   Vascular: Pain in legs with walking [ ] ; Pain in feet with lying flat [ ] ; Non-healing sores [ ] ; Stroke [ ] ; TIA [ ] ; Slurred speech [ ] ;  Neuro: Headaches[ ] ; Vertigo[ ] ; Seizures[ ] ;  Paresthesias[ ] ;Blurred vision [ ] ; Diplopia [ ] ; Vision changes [ ]   Ortho/Skin: Arthritis [ ] ; Joint pain [ ] ; Muscle pain [ ] ; Joint swelling [ ] ; Back Pain [ ] ; Rash [ ]   Psych: Depression[ ] ; Anxiety[ ]   Heme: Bleeding problems [ ] ; Clotting disorders [ ] ; Anemia [ ]   Endocrine: Diabetes [ ] ; Thyroid dysfunction[ ]    Past Medical History:  Diagnosis Date   Asthma    Cardiomyopathy (HCC)    CHF (congestive heart failure) (HCC)    Glaucoma    History of COVID-19    Hypertension    Migraine     Current Outpatient Medications  Medication Sig Dispense Refill   acetaminophen (TYLENOL) 500 MG tablet Take 500 mg by mouth every 6 (six) hours as needed for moderate pain.     losartan (COZAAR) 50 MG tablet TAKE 1 TABLET BY MOUTH DAILY 30 tablet 3   metoprolol succinate (TOPROL-XL) 50 MG 24 hr tablet TAKE 1 TABLET BY MOUTH EVERY MORNING WITH OR IMMEDIATELY FOLLOWING A MEAL 30 tablet 3   omeprazole (PRILOSEC) 20 MG capsule Take 20 mg by mouth daily as needed (heart burn).     spironolactone (ALDACTONE) 25 MG tablet Take 1 tablet (25 mg total) by mouth daily. 90 tablet 1   torsemide (DEMADEX) 10 MG tablet Take 1 tablet (10 mg total) by mouth every morning.  90 tablet 1   No current facility-administered medications for this encounter.    No Known Allergies    Social History   Socioeconomic History   Marital status: Married    Spouse name: Ruweyda Macknight   Number of children: 0   Years of education: Not on file   Highest education level: Associate degree: occupational, Scientist, product/process development, or vocational program  Occupational History   Occupation: CMA  Tobacco Use   Smoking status: Never   Smokeless tobacco: Never  Vaping Use   Vaping status: Never Used  Substance and Sexual Activity   Alcohol use: Not Currently    Alcohol/week: 1.0 standard drink of alcohol    Types: 1 Glasses of wine per week   Drug use: No   Sexual activity: Yes    Partners: Male  Other Topics Concern   Not on  file  Social History Narrative   Lives with her husband     Left handed   Caffeine: 1 cup of coffee 3x week.    Social Determinants of Health   Financial Resource Strain: Not on file  Food Insecurity: Not on file  Transportation Needs: Not on file  Physical Activity: Not on file  Stress: Not on file  Social Connections: Unknown (07/03/2021)   Received from [facility], [facility]   Social Network    Social Network: Not on file  Intimate Partner Violence: Unknown (05/25/2021)   Received from [facility], [facility]   HITS    Physically Hurt: Not on file    Insult or Talk Down To: Not on file    Threaten Physical Harm: Not on file    Scream or Curse: Not on file      Family History  Problem Relation Age of Onset   Hypertension Mother    Stroke Mother    Seizures Mother    Ulcers Mother        duodenal and jejunal   Cancer Father        brain   Seizures Father 82       secondary to brain cancer   Bladder Cancer Brother 73   Stroke Maternal Grandmother    Hypertension Maternal Grandmother    Diabetes Maternal Grandfather    Heart disease Paternal Grandmother    Pneumonia Paternal Grandfather    Colon cancer Neg Hx    Pancreatic cancer Neg Hx    Stomach cancer Neg Hx     Vitals:   11/23/22 1431  BP: 104/70  Pulse: 61  SpO2: 98%  Weight: 90.8 kg (200 lb 3.2 oz)   Wt Readings from Last 3 Encounters:  11/23/22 90.8 kg (200 lb 3.2 oz)  08/14/22 90.7 kg (200 lb)  02/02/22 96.5 kg (212 lb 12.8 oz)    PHYSICAL EXAM: General:  Well appearing. No respiratory difficulty HEENT: normal Neck: supple. no JVD. Carotids 2+ bilat; no bruits. No lymphadenopathy or thryomegaly appreciated. Cor: PMI nondisplaced. Regular rate & rhythm. No rubs, gallops or murmurs. Split s2 Lungs: clear Abdomen: obese soft, nontender, nondistended. No hepatosplenomegaly. No bruits or masses. Good bowel sounds. Extremities: no cyanosis, clubbing, rash, edema Neuro: alert & oriented x  3, cranial nerves grossly intact. moves all 4 extremities w/o difficulty. Affect pleasant.  ECG: NSR 63 LBBB Personally reviewed   ASSESSMENT & PLAN:  1) Chronic systolic HF due to NICM  - Diagnosed in 2022. Felt to be related to COVID. Left/right heart catheterization April 2022: No obstructive disease, normal filling pressures, compensated  nonischemic cardiomyopathy - Echo 07/2021: LVEF <20%, grade 1 diastolic dysfunction, mild MR/TR  - cMRI 11/16/22: EF 32% + septal dyssynergy No LGE. RVEF normal 51% - Has been evaluated by Dr. Ladona Ridgel for BiV ICD implant-patient has refused in the past. Has been off [place] due to UTI. Off entresto due to cost.  - NYHA II. Volume ok on torsemide 10 daily - Continue losartan 50mg  daily - Continue Toprol 50 mg daily - Continue spiro 25mg  daily  - Off Farxiga due to urinary tract infections. - On maximally-tolerated GDMT - Suspect cardiomyopathy likely multifactorial due to LBBB and possibly covid in past. EF now up to 32%. I doubt EF will get much better without CRT. We discussed this at length today. Have recommended referral back to EP    2) LBBB (left bundle branch block) - Like has LBBB CM - has seen EP re CRT  - plan as above  Total time spent 45 minutes. Over half that time spent discussing above.    Arvilla Meres, MD  3:02 PM

## 2022-11-23 NOTE — Patient Instructions (Addendum)
Good to see you today!  Follow up with [place] as needed   If you have any questions or concerns before your next appointment please send [place] a message through [place] or call our office at [phone].    TO LEAVE A MESSAGE FOR THE NURSE SELECT OPTION 2, PLEASE LEAVE A MESSAGE INCLUDING: YOUR NAME DATE OF BIRTH CALL BACK NUMBER REASON FOR CALL**this is important as we prioritize the call backs  YOU WILL RECEIVE A CALL BACK THE SAME DAY AS LONG AS YOU CALL BEFORE 4:00 PM  At the [facility], you and your health needs are our priority. As part of our continuing mission to provide you with exceptional heart care, we have created designated Provider Care Teams. These Care Teams include your primary Cardiologist (physician) and Advanced Practice Providers (APPs- Physician Assistants and Nurse Practitioners) who all work together to provide you with the care you need, when you need it.   You may see any of the following providers on your designated Care Team at your next follow up: Dr Arvilla Meres Dr Marca Ancona Dr. Dorthula Nettles Dr. Clearnce Hasten Amy Filbert Schilder, NP Robbie Lis, [place] [facility] [place], [place] Brynda Peon, NP [place] Lee, NP Karle Plumber, PharmD   Please be sure to bring in all your medications bottles to every appointment.    Thank you for choosing [facility]

## 2023-02-06 ENCOUNTER — Ambulatory Visit: Payer: PRIVATE HEALTH INSURANCE | Admitting: Cardiology

## 2023-02-07 ENCOUNTER — Ambulatory Visit: Payer: PRIVATE HEALTH INSURANCE | Admitting: Cardiology

## 2023-02-19 ENCOUNTER — Other Ambulatory Visit: Payer: Self-pay | Admitting: Cardiology

## 2023-02-19 DIAGNOSIS — I5042 Chronic combined systolic (congestive) and diastolic (congestive) heart failure: Secondary | ICD-10-CM

## 2023-02-19 DIAGNOSIS — I428 Other cardiomyopathies: Secondary | ICD-10-CM

## 2023-03-06 ENCOUNTER — Other Ambulatory Visit (HOSPITAL_COMMUNITY): Payer: Self-pay | Admitting: Internal Medicine

## 2023-03-09 ENCOUNTER — Telehealth: Payer: Self-pay | Admitting: Cardiology

## 2023-03-09 ENCOUNTER — Ambulatory Visit: Payer: PRIVATE HEALTH INSURANCE | Attending: Cardiology | Admitting: Cardiology

## 2023-03-09 VITALS — BP 120/70 | HR 78 | Resp 16 | Ht 61.0 in | Wt 206.2 lb

## 2023-03-09 DIAGNOSIS — Z8616 Personal history of COVID-19: Secondary | ICD-10-CM

## 2023-03-09 DIAGNOSIS — I428 Other cardiomyopathies: Secondary | ICD-10-CM

## 2023-03-09 DIAGNOSIS — I5042 Chronic combined systolic (congestive) and diastolic (congestive) heart failure: Secondary | ICD-10-CM

## 2023-03-09 DIAGNOSIS — I447 Left bundle-branch block, unspecified: Secondary | ICD-10-CM | POA: Diagnosis not present

## 2023-03-09 MED ORDER — LOSARTAN POTASSIUM 50 MG PO TABS
50.0000 mg | ORAL_TABLET | Freq: Every day | ORAL | 3 refills | Status: DC
Start: 2023-03-09 — End: 2023-07-09

## 2023-03-09 MED ORDER — METOPROLOL SUCCINATE ER 50 MG PO TB24
ORAL_TABLET | ORAL | 3 refills | Status: DC
Start: 2023-03-09 — End: 2023-07-09

## 2023-03-09 NOTE — Telephone Encounter (Signed)
Pt had labs down 03/02/23, scanned in Media tab. She states Dr. Odis Hollingshead didn't mention them today at her appt, she wants to know did he review and if they were normal.

## 2023-03-09 NOTE — Progress Notes (Unsigned)
Cardiology Office Note:  .   ID:  Marie Porter, DOB 06-20-1960, MRN [id number] PCP:  Lance Bosch, NP  Former Cardiology Providers: None [facility] Providers Cardiologist:  Tessa Lerner, DO Advanced Heart Failure:  Arvilla Meres, MD , [facility] (established care 06/07/2020) Electrophysiologist:  None  Click to update primary MD,subspecialty MD or APP then REFRESH:1}    Chief Complaint  Patient presents with   Chronic combined systolic and diastolic heart failure, NYHA   Follow-up    History of Present Illness: .   Marie Porter is a 63 y.o.  female whose past medical history and cardiovascular risk factors includes: Hx of COVID 13 infection (May 2021 and January 2022), nonischemic cardiomyopathy, chronic HFrEF, history of glaucoma, hypertension, obesity due to excess calories.   Patient is being followed by the practice given her history of nonischemic cardiomyopathy.  She has been evaluated by EP in the past for BiV ICD and patient chose to hold off.  Since last office visit she also had a cardiac MRI results reviewed and noted below.  She has also established care with advanced heart failure to see if she is a candidate for additional therapies given her young age.  Since last office visit she underwent a cardiac MRI results reviewed with her and her husband during today's office visit.  Clinically denies anginal chest pain or heart failure symptoms.  She has noticeable dyspnea with overexertion and/or walking up an incline.  Review of Systems: .   Review of Systems  Cardiovascular:  Negative for chest pain, claudication, irregular heartbeat, leg swelling, near-syncope, orthopnea, palpitations, paroxysmal nocturnal dyspnea and syncope.  Respiratory:  Negative for shortness of breath.   Hematologic/Lymphatic: Negative for bleeding problem.   Studies Reviewed:    Echocardiogram: 08/15/2021:  Left ventricle cavity is severely dilated. Normal left ventricular wall  thickness. Abnormal septal wall motion due to LBBB. Severe global hypokinesis. LVEF <20%. Doppler evidence of grade I (impaired) diastolic dysfunction, normal LAP.  Left atrial cavity is mildly dilated.  Mild (Grade I) mitral regurgitation.  Mild tricuspid regurgitation.  No evidence of pulmonary hypertension.  Compared to previous study on 10/29/2020, pericardial effusion not appreciated. No other significant change noted.   Right and Left Heart Cath:  06/18/2020:  No coronary artery disease Normal filling pressures Compensated nonischemic cardiomyopathy  CMRI 10/2022 1. Normal LV size, mild hypertrophy, and moderate systolic dysfunction (EF 32%). Septal dyskinesis consistent with LBBB   2.  Small RV size with normal systolic function (EF 51%)   3.  No late gadolinium enhancement to suggest myocardial scar  RADIOLOGY: NA  Risk Assessment/Calculations:   NA   Labs:    Date Collected: 05/03/2020 , information obtained by [facility] Potassium: 4.7 Creatinine 0.95 mg/dL. eGFR: 69 mL/min per 1.73 m Hematocrit: 43.1 % Lipid profile: Total cholesterol 186 , triglycerides 66 , HDL 64 , LDL 110 AST: 22 , ALT: 32 , alkaline phosphatase: 97 BNP 691 (761 04/27/2020) TSH: 2.220   External labs: 06/03/2020: Records provided by the patient. Sodium 144, potassium 4.6, chloride 104, bicarb 22, creatinine 0.84, BUN 19, BNP 413   06/16/2020: Sodium 146, potassium 5.1, chloride 107, bicarb 23, BUN 17, creatinine 0.82, BNP 438 magnesium 2.1   External Labs: Collected: 07/22/2020 performed at her PCPs office. Creatinine 0.81 mg/dL. eGFR: 84 mL/min per 1.73 m NT proBNP 1495 Sodium 144 potassium 4.9, chloride 106, bicarb 22   Collected: 07/29/2020. Sodium 141, potassium 4.7, chloride 110, bicarb 17, BUN 16, creatinine 0.78,  NT proBNP 1118. Magnesium 2.1   External Labs: Collected: Jul 04, 2021 BUN 24, creatinine 1.08. eGFR 59. Sodium 141, potassium 5, chloride 105, bicarb 19 BNP 75    Collected: Jul 11, 2021 Sodium 142, potassium 4.8, chloride 106, bicarb 22. BUN 18, creatinine 0.87   External Labs: Collected: 01/04/2022 BUN 28, creatinine 1.1. eGFR 57. Sodium 140, potassium 4.3, chloride 104, bicarb 21. NT-proBNP 413 Magnesium 1.9   Collected: November 2023 Creatinine 1.12. eGFR 57. NT-proBNP 413   External Labs: Collected: July 27, 2022 provided by PCP. BUN 21, creatinine 1.2 eGFR 51. Sodium 142, potassium 4.2, chloride 106, bicarb 19 NT proBNP 131 Magnesium 2.2.  External Labs: Collected: March 02, 2023: BUN 21, creatinine 1.27. eGFR 48. Sodium 142, potassium 4.3, chloride 105, bicarb 22. AST, ALT, alkaline phosphatase within normal limits. NT proBNP 179. Magnesium 1.9  Physical Exam:    Today's Vitals   03/09/23 0934  BP: 120/70  Pulse: 78  Resp: 16  SpO2: 98%  Weight: 206 lb 3.2 oz (93.5 kg)  Height: 5\' 1"  (1.549 m)   Body mass index is 38.96 kg/m. Wt Readings from Last 3 Encounters:  03/09/23 206 lb 3.2 oz (93.5 kg)  11/23/22 200 lb 3.2 oz (90.8 kg)  08/14/22 200 lb (90.7 kg)    Physical Exam  Constitutional: No distress.  Age appropriate, hemodynamically stable.   Neck: No JVD present.  Cardiovascular: Normal rate, regular rhythm, S1 normal, S2 normal, intact distal pulses and normal pulses. Exam reveals no gallop, no S3 and no S4.  No murmur heard. Pulmonary/Chest: Effort normal and breath sounds normal. No stridor. She has no wheezes. She has no rales.  Abdominal: Soft. Bowel sounds are normal. She exhibits no distension. There is no abdominal tenderness.  Musculoskeletal:        General: No edema.     Cervical back: Neck supple.  Neurological: She is alert and oriented to person, place, and time. She has intact cranial nerves (2-12).  Skin: Skin is warm and moist.   Impression & Recommendation(s):  Impression:   ICD-10-CM   1. Nonischemic cardiomyopathy (HCC)  I42.8 losartan (COZAAR) 50 MG tablet    metoprolol  succinate (TOPROL-XL) 50 MG 24 hr tablet    2. Chronic combined systolic and diastolic heart failure (HCC)  L24.40 losartan (COZAAR) 50 MG tablet    metoprolol succinate (TOPROL-XL) 50 MG 24 hr tablet    3. LBBB (left bundle branch block)  I44.7     4. History of COVID-19  Z86.16        Recommendation(s):  Nonischemic cardiomyopathy (HCC) Chronic combined systolic and diastolic heart failure (HCC) Stage B, NYHA class II Factors to consider: COVID-19 infections, Dyssynchrony secondary to LBBB, etc. June 2023 echocardiogram: LVEF <20%, see report for additional details September 2024 cardiac MRI: LVEF 32%, right ventricular function is within normal limits, no scar In the past patient has been evaluated by EP for device therapy-patient has refused.  And still feels the same. Sherryll Burger has been cost prohibitive Farxiga related UTIs Currently on losartan 50 mg p.o. daily. Currently on Toprol-XL 50 mg p.o. daily. Currently on spironolactone 25 mg p.o. daily. Currently on torsemide 10 mg p.o. every morning. Refilled losartan and Toprol-XL. Independently reviewed labs from March 02, 2023 provided by PCP and available in media section-relatively at baseline Patient plans to move to [facility] and they recently bought a house for short period of time, advised her to establish care close by this in case if  she has any acute decompensation or needs medical attention Establish care with advanced heart failure in October 2024, progress note/consultation report reviewed  Total time spent: 9 minutes-reviewing electronic medical records, reviewed outside labs from 03/02/2023, reviewed consultation note from Dr. Gala Romney 11/23/2022, cardiac MRI results from September 2024, refill of medications, discussing disease management, longitudinal follow-up, and her transition to care possibly to a provider locally as he moved to [facility] versus continuing follow-up with [facility].  Orders Placed:   No orders of the defined types were placed in this encounter.  Final Medication List:    Meds ordered this encounter  Medications   losartan (COZAAR) 50 MG tablet    Sig: Take 1 tablet (50 mg total) by mouth daily.    Dispense:  30 tablet    Refill:  3   metoprolol succinate (TOPROL-XL) 50 MG 24 hr tablet    Sig: TAKE 1 TABLET BY MOUTH EVERY MORNING WITH OR IMMEDIATELY FOLLOWING A MEAL    Dispense:  30 tablet    Refill:  3    Medications Discontinued During This Encounter  Medication Reason   losartan (COZAAR) 50 MG tablet Reorder   metoprolol succinate (TOPROL-XL) 50 MG 24 hr tablet Reorder     Current Outpatient Medications:    acetaminophen (TYLENOL) 500 MG tablet, Take 500 mg by mouth every 6 (six) hours as needed for moderate pain., Disp: , Rfl:    omeprazole (PRILOSEC) 20 MG capsule, Take 20 mg by mouth daily as needed (heart burn)., Disp: , Rfl:    spironolactone (ALDACTONE) 25 MG tablet, TAKE 1 TABLET BY MOUTH DAILY, Disp: 90 tablet, Rfl: 1   torsemide (DEMADEX) 10 MG tablet, Take 1 tablet (10 mg total) by mouth every morning., Disp: 90 tablet, Rfl: 1   losartan (COZAAR) 50 MG tablet, Take 1 tablet (50 mg total) by mouth daily., Disp: 30 tablet, Rfl: 3   metoprolol succinate (TOPROL-XL) 50 MG 24 hr tablet, TAKE 1 TABLET BY MOUTH EVERY MORNING WITH OR IMMEDIATELY FOLLOWING A MEAL, Disp: 30 tablet, Rfl: 3  Consent:   NA  Disposition:   6 months sooner if needed  Patient may be asked to follow-up sooner based on the results of the above-mentioned testing.  Her questions and concerns were addressed to her satisfaction. She voices understanding of the recommendations provided during this encounter.    Signed, Tessa Lerner, DO, [facility]  [facility]  [address] [place], [place] [address] 03/11/2023 12:39 PM

## 2023-03-09 NOTE — Patient Instructions (Signed)
Medication Instructions:  Your Metoprolol Succinate (Toprol XL) and Losartan (Cozaar) has been refilled *If you need a refill on your cardiac medications before your next appointment, please call your pharmacy*  Follow-Up: At [facility], you and your health needs are our priority.  As part of our continuing mission to provide you with exceptional heart care, we have created designated Provider Care Teams.  These Care Teams include your primary Cardiologist (physician) and Advanced Practice Providers (APPs -  Physician Assistants and Nurse Practitioners) who all work together to provide you with the care you need, when you need it.  We recommend signing up for the patient portal called "MyChart".  Sign up information is provided on this After Visit Summary.  MyChart is used to connect with patients for Virtual Visits (Telemedicine).  Patients are able to view lab/test results, encounter notes, upcoming appointments, etc.  Non-urgent messages can be sent to your provider as well.   To learn more about what you can do with MyChart, go to [email or web address].    Your next appointment:   6 month(s)  Provider:   Tessa Lerner, DO     Other Instructions   1st Floor: - Lobby - Registration  - Pharmacy  - Lab - Cafe  2nd Floor: - PV Lab - Diagnostic Testing (echo, CT, nuclear med)  3rd Floor: - Vacant  4th Floor: - TCTS (cardiothoracic surgery) - AFib Clinic - Structural Heart Clinic - Vascular Surgery  - Vascular Ultrasound  5th Floor: - HeartCare Cardiology (general and EP) - Clinical Pharmacy for coumadin, hypertension, lipid, weight-loss medications, and med management appointments    Valet parking services will be available as well.

## 2023-03-11 ENCOUNTER — Encounter: Payer: Self-pay | Admitting: Cardiology

## 2023-03-12 ENCOUNTER — Encounter: Payer: Self-pay | Admitting: Internal Medicine

## 2023-03-12 NOTE — Telephone Encounter (Signed)
Please refer to the last office note.   Shloka Baldridge Hunter, DO, [facility]

## 2023-03-13 ENCOUNTER — Telehealth: Payer: Self-pay | Admitting: Cardiology

## 2023-03-13 NOTE — Telephone Encounter (Signed)
Attempted to call pt but unable to reach over the phone. LVM per DPR on file and explained that per Dr. Emelda Brothers OV note from 03/09/23, he reviewed the outside labs from pt's PCP and the lab work is relatively at the pt's baseline. No recommendations were made based off of the lab work. Told pt to call our office with any other questions or concerns.

## 2023-03-13 NOTE — Telephone Encounter (Signed)
Pt returned call, made aware of below documentation. Pt expressed understanding

## 2023-03-13 NOTE — Telephone Encounter (Signed)
Error

## 2023-04-02 ENCOUNTER — Encounter: Payer: Self-pay | Admitting: Nurse Practitioner

## 2023-04-06 ENCOUNTER — Other Ambulatory Visit: Payer: Self-pay | Admitting: Cardiology

## 2023-04-06 DIAGNOSIS — I5042 Chronic combined systolic (congestive) and diastolic (congestive) heart failure: Secondary | ICD-10-CM

## 2023-04-06 DIAGNOSIS — I428 Other cardiomyopathies: Secondary | ICD-10-CM

## 2023-05-15 ENCOUNTER — Ambulatory Visit: Payer: PRIVATE HEALTH INSURANCE | Admitting: Nurse Practitioner

## 2023-07-07 ENCOUNTER — Other Ambulatory Visit: Payer: Self-pay | Admitting: Cardiology

## 2023-07-07 DIAGNOSIS — I5042 Chronic combined systolic (congestive) and diastolic (congestive) heart failure: Secondary | ICD-10-CM

## 2023-07-07 DIAGNOSIS — I428 Other cardiomyopathies: Secondary | ICD-10-CM

## 2023-07-09 ENCOUNTER — Other Ambulatory Visit: Payer: Self-pay | Admitting: *Deleted

## 2023-07-09 DIAGNOSIS — I5042 Chronic combined systolic (congestive) and diastolic (congestive) heart failure: Secondary | ICD-10-CM

## 2023-07-09 DIAGNOSIS — I428 Other cardiomyopathies: Secondary | ICD-10-CM

## 2023-07-09 MED ORDER — METOPROLOL SUCCINATE ER 50 MG PO TB24: ORAL_TABLET | ORAL | 3 refills | Status: DC

## 2023-07-09 MED ORDER — LOSARTAN POTASSIUM 50 MG PO TABS: 50.0000 mg | ORAL_TABLET | Freq: Every day | ORAL | 3 refills | Status: DC

## 2023-07-09 NOTE — Telephone Encounter (Signed)
 Pt does not want these medication sent to [facility]. [place]  [id number] - [address] [place] - [phone] [facility]. AT [facility] OF [address] Phone: [phone]  Fax: [phone]    Pt is out of medication

## 2023-07-09 NOTE — Telephone Encounter (Signed)
Refills has been sent to the correct pharmacy. °

## 2023-08-10 ENCOUNTER — Other Ambulatory Visit: Payer: Self-pay | Admitting: Cardiology

## 2023-08-10 DIAGNOSIS — I428 Other cardiomyopathies: Secondary | ICD-10-CM

## 2023-08-10 DIAGNOSIS — I5042 Chronic combined systolic (congestive) and diastolic (congestive) heart failure: Secondary | ICD-10-CM

## 2023-08-21 ENCOUNTER — Other Ambulatory Visit: Payer: Self-pay | Admitting: Cardiology

## 2023-08-21 DIAGNOSIS — I5042 Chronic combined systolic (congestive) and diastolic (congestive) heart failure: Secondary | ICD-10-CM

## 2023-08-21 DIAGNOSIS — I428 Other cardiomyopathies: Secondary | ICD-10-CM

## 2023-08-30 LAB — LAB REPORT - SCANNED: EGFR: 43

## 2023-08-31 ENCOUNTER — Ambulatory Visit: Payer: Self-pay | Admitting: Cardiology

## 2023-09-05 ENCOUNTER — Ambulatory Visit: Payer: PRIVATE HEALTH INSURANCE | Attending: Cardiology | Admitting: Cardiology

## 2023-09-05 ENCOUNTER — Encounter: Payer: Self-pay | Admitting: Cardiology

## 2023-09-05 VITALS — BP 130/78 | HR 58 | Resp 16 | Ht 61.0 in | Wt 199.0 lb

## 2023-09-05 DIAGNOSIS — I5042 Chronic combined systolic (congestive) and diastolic (congestive) heart failure: Secondary | ICD-10-CM | POA: Diagnosis not present

## 2023-09-05 DIAGNOSIS — I447 Left bundle-branch block, unspecified: Secondary | ICD-10-CM | POA: Diagnosis not present

## 2023-09-05 DIAGNOSIS — Z8616 Personal history of COVID-19: Secondary | ICD-10-CM

## 2023-09-05 DIAGNOSIS — I428 Other cardiomyopathies: Secondary | ICD-10-CM

## 2023-09-05 MED ORDER — LOSARTAN POTASSIUM 50 MG PO TABS: 50.0000 mg | ORAL_TABLET | Freq: Every day | ORAL | 3 refills | Status: AC

## 2023-09-05 MED ORDER — METOPROLOL SUCCINATE ER 50 MG PO TB24
ORAL_TABLET | ORAL | 3 refills | Status: AC
Start: 2023-09-05 — End: ?

## 2023-09-05 NOTE — Progress Notes (Signed)
 Cardiology Office Note:  .   ID:  Marie Porter, DOB Sep 29, 1960, MRN [id number] PCP:  Barbra Odor, NP  Former Cardiology Providers: None [facility] Providers Cardiologist:  Madonna Large, DO, [facility] (established care 06/07/2020)  Advanced Heart Failure:  Toribio Fuel, MD  Electrophysiologist:  None  Click to update primary MD,subspecialty MD or APP then REFRESH:1}    Chief Complaint  Patient presents with   Chronic combined systolic and diastolic heart failure, NYHA   Follow-up    History of Present Illness: .   Marie Porter is a 64 y.o.  female whose past medical history and cardiovascular risk factors includes: Hx of COVID 54 infection (May 2021 and January 2022), nonischemic cardiomyopathy, chronic HFrEF, history of glaucoma, hypertension, obesity due to excess calories.   Patient is being followed by the practice given her history of nonischemic cardiomyopathy.  She has been evaluated by EP in the past for BiV ICD and patient chose to hold off.  Since last office visit she also had a cardiac MRI results reviewed and noted below.  She has also established care with advanced heart failure to see if she is a candidate for additional therapies given her young age.  Since last office visit patient denies any anginal chest pain or heart failure symptoms.  No hospitalizations or urgent care visits for cardiovascular reasons.  She has been compliant with her medical therapy.  Has lost 7 pounds since last office visit.  She ambulates on a regular basis.  Home blood pressures are well-controlled.  Patient has been evaluated for possible CRT-D but chooses to hold off.  Requesting refills on losartan  and Toprol -XL.  Review of Systems: .   Review of Systems  Cardiovascular:  Negative for chest pain, claudication, irregular heartbeat, leg swelling, near-syncope, orthopnea, palpitations, paroxysmal nocturnal dyspnea and syncope.  Respiratory:  Negative for shortness of  breath.   Hematologic/Lymphatic: Negative for bleeding problem.   Studies Reviewed:   EKG Interpretation Date/Time:  Wednesday September 05 2023 09:19:19 EDT Ventricular Rate:  59 PR Interval:  154 QRS Duration:  168 QT Interval:  488 QTC Calculation: 483 R Axis:   260  Text Interpretation: Sinus bradycardia Non-specific intra-ventricular conduction block When compared with ECG of 23-Nov-2022 14:35, No significant change since last tracing Confirmed by Large Madonna [phone]) on 09/05/2023 9:54:13 AM  Echocardiogram: 08/15/2021:  Left ventricle cavity is severely dilated. Normal left ventricular wall thickness. Abnormal septal wall motion due to LBBB. Severe global hypokinesis. LVEF <20%. Doppler evidence of grade I (impaired) diastolic dysfunction, normal LAP.  Left atrial cavity is mildly dilated.  Mild (Grade I) mitral regurgitation.  Mild tricuspid regurgitation.  No evidence of pulmonary hypertension.  Compared to previous study on 10/29/2020, pericardial effusion not appreciated. No other significant change noted.   Right and Left Heart Cath:  06/18/2020:  No coronary artery disease Normal filling pressures Compensated nonischemic cardiomyopathy  CMRI 10/2022 1. Normal LV size, mild hypertrophy, and moderate systolic dysfunction (EF 32%). Septal dyskinesis consistent with LBBB   2.  Small RV size with normal systolic function (EF 51%)   3.  No late gadolinium enhancement to suggest myocardial scar  RADIOLOGY: NA  Risk Assessment/Calculations:   NA   Labs:    Date Collected: 05/03/2020 , information obtained by [facility] Potassium: 4.7 Creatinine 0.95 mg/dL. eGFR: 69 mL/min per 1.73 m Hematocrit: 43.1 % Lipid profile: Total cholesterol 186 , triglycerides 66 , HDL 64 , LDL 110 AST: 22 , ALT: 32 ,  alkaline phosphatase: 97 BNP 691 (761 04/27/2020) TSH: 2.220   External labs: 06/03/2020: Records provided by the patient. Sodium 144, potassium 4.6, chloride 104, bicarb 22,  creatinine 0.84, BUN 19, BNP 413   06/16/2020: Sodium 146, potassium 5.1, chloride 107, bicarb 23, BUN 17, creatinine 0.82, BNP 438 magnesium 2.1   External Labs: Collected: 07/22/2020 performed at her PCPs office. Creatinine 0.81 mg/dL. eGFR: 84 mL/min per 1.73 m NT proBNP 1495 Sodium 144 potassium 4.9, chloride 106, bicarb 22   Collected: 07/29/2020. Sodium 141, potassium 4.7, chloride 110, bicarb 17, BUN 16, creatinine 0.78, NT proBNP 1118. Magnesium 2.1   External Labs: Collected: Jul 04, 2021 BUN 24, creatinine 1.08. eGFR 59. Sodium 141, potassium 5, chloride 105, bicarb 19 BNP 75   Collected: Jul 11, 2021 Sodium 142, potassium 4.8, chloride 106, bicarb 22. BUN 18, creatinine 0.87   External Labs: Collected: 01/04/2022 BUN 28, creatinine 1.1. eGFR 57. Sodium 140, potassium 4.3, chloride 104, bicarb 21. NT-proBNP 413 Magnesium 1.9   Collected: November 2023 Creatinine 1.12. eGFR 57. NT-proBNP 413   External Labs: Collected: July 27, 2022 provided by PCP. BUN 21, creatinine 1.2 eGFR 51. Sodium 142, potassium 4.2, chloride 106, bicarb 19 NT proBNP 131 Magnesium 2.2.  External Labs: Collected: March 02, 2023: BUN 21, creatinine 1.27. eGFR 48. Sodium 142, potassium 4.3, chloride 105, bicarb 22. AST, ALT, alkaline phosphatase within normal limits. NT proBNP 179. Magnesium 1.9  External Labs: Collected: August 29, 2023 BUN 23, creatinine 1.39 ALT, Alkaline Phosphatase within Normal Limits. NT proBNP 93. Magnesium 2.1  Physical Exam:    Today's Vitals   09/05/23 0916  BP: 130/78  Pulse: (!) 58  Resp: 16  SpO2: 96%  Weight: 199 lb (90.3 kg)  Height: 5' 1 (1.549 m)   Body mass index is 37.6 kg/m. Wt Readings from Last 3 Encounters:  09/05/23 199 lb (90.3 kg)  03/09/23 206 lb 3.2 oz (93.5 kg)  11/23/22 200 lb 3.2 oz (90.8 kg)    Physical Exam  Constitutional: No distress.  Age appropriate, hemodynamically stable.   Neck: No JVD present.   Cardiovascular: Normal rate, regular rhythm, S1 normal, S2 normal, intact distal pulses and normal pulses. Exam reveals no gallop, no S3 and no S4.  No murmur heard. Pulmonary/Chest: Effort normal and breath sounds normal. No stridor. She has no wheezes. She has no rales.  Abdominal: Soft. Bowel sounds are normal. She exhibits no distension. There is no abdominal tenderness.  Musculoskeletal:        General: No edema.     Cervical back: Neck supple.  Neurological: She is alert and oriented to person, place, and time. She has intact cranial nerves (2-12).  Skin: Skin is warm and moist.   Impression & Recommendation(s):  Impression:   ICD-10-CM   1. Nonischemic cardiomyopathy (HCC)  I42.8 losartan  (COZAAR ) 50 MG tablet    metoprolol  succinate (TOPROL -XL) 50 MG 24 hr tablet    2. Chronic combined systolic and diastolic heart failure (HCC)  P49.57 EKG 12-Lead    losartan  (COZAAR ) 50 MG tablet    metoprolol  succinate (TOPROL -XL) 50 MG 24 hr tablet    3. LBBB (left bundle branch block)  I44.7     4. History of COVID-19  Z86.16        Recommendation(s):  Nonischemic cardiomyopathy (HCC) Chronic combined systolic and diastolic heart failure (HCC) Stage B, NYHA class II Factors to consider: COVID-19 infections, Dyssynchrony secondary to LBBB, etc. June 2023 echocardiogram: LVEF <20%, see report  for additional details September 2024 cardiac MRI: LVEF 32%, right ventricular function is within normal limits, no scar In the past patient has been evaluated by EP for device therapy-patient has refused.  And still feels the same. Entresto  has been cost prohibitive Farxiga  related UTIs Currently on losartan  50 mg p.o. daily.   Currently on Toprol -XL 50 mg p.o. daily.   Currently on spironolactone  25 mg p.o. daily. Currently on torsemide  10 mg p.o. every morning. Refilled losartan  and Toprol -XL. Independently reviewed labs from July 9th 2025-advised her to increase water intake by 2 to 3  glasses/day. Has been evaluated by Dr. Waddell for possible BiV ICD implant but patient has refused in the past and continues to feel the same.   Clinically remains relatively stable since last visit. Recommend 1 year follow-up visit  LBBB: Chronic and stable.  Offered CRT+/-D patient choose to hold off.   Orders Placed:  Orders Placed This Encounter  Procedures   EKG 12-Lead   Final Medication List:    Meds ordered this encounter  Medications   losartan  (COZAAR ) 50 MG tablet    Sig: Take 1 tablet (50 mg total) by mouth daily.    Dispense:  90 tablet    Refill:  3   metoprolol  succinate (TOPROL -XL) 50 MG 24 hr tablet    Sig: TAKE 1 TABLET BY MOUTH EVERY MORNING WITH OR IMMEDIATELY FOLLOWING A MEAL    Dispense:  90 tablet    Refill:  3    Medications Discontinued During This Encounter  Medication Reason   losartan  (COZAAR ) 50 MG tablet Reorder   metoprolol  succinate (TOPROL -XL) 50 MG 24 hr tablet Reorder      Current Outpatient Medications:    acetaminophen  (TYLENOL ) 500 MG tablet, Take 500 mg by mouth every 6 (six) hours as needed for moderate pain., Disp: , Rfl:    omeprazole (PRILOSEC) 20 MG capsule, Take 20 mg by mouth daily as needed (heart burn)., Disp: , Rfl:    spironolactone  (ALDACTONE ) 25 MG tablet, TAKE 1 TABLET BY MOUTH DAILY, Disp: 90 tablet, Rfl: 1   torsemide  (DEMADEX ) 10 MG tablet, TAKE 1 TABLET BY MOUTH EVERY MORNING, Disp: 90 tablet, Rfl: 3   losartan  (COZAAR ) 50 MG tablet, Take 1 tablet (50 mg total) by mouth daily., Disp: 90 tablet, Rfl: 3   metoprolol  succinate (TOPROL -XL) 50 MG 24 hr tablet, TAKE 1 TABLET BY MOUTH EVERY MORNING WITH OR IMMEDIATELY FOLLOWING A MEAL, Disp: 90 tablet, Rfl: 3  Consent:   NA  Disposition:   1 year follow-up sooner if needed  Patient may be asked to follow-up sooner based on the results of the above-mentioned testing.  Her questions and concerns were addressed to her satisfaction. She voices understanding of the  recommendations provided during this encounter.    Signed, Madonna Michele HAS, [facility]  A Division of [facility] [address] [place], [place] [address]  [place], [place] [address] 09/05/2023 11:26 AM

## 2023-09-05 NOTE — Patient Instructions (Addendum)
 Medication Instructions:  Refill of Losartan  (cozaar ) and metoprolol  succinate (toprol  XL)  Follow-Up: At [facility], you and your health needs are our priority.  As part of our continuing mission to provide you with exceptional heart care, our providers are all part of one team.  This team includes your primary Cardiologist (physician) and Advanced Practice Providers or APPs (Physician Assistants and Nurse Practitioners) who all work together to provide you with the care you need, when you need it.  Your next appointment:   1 year(s)  Provider:   Madonna Large, DO    We recommend signing up for the patient portal called MyChart.  Sign up information is provided on this After Visit Summary.  MyChart is used to connect with patients for Virtual Visits (Telemedicine).  Patients are able to view lab/test results, encounter notes, upcoming appointments, etc.  Non-urgent messages can be sent to your provider as well.   To learn more about what you can do with MyChart, go to [email or web address].

## 2024-02-06 ENCOUNTER — Telehealth: Payer: Self-pay | Admitting: Cardiology

## 2024-02-06 DIAGNOSIS — I5042 Chronic combined systolic (congestive) and diastolic (congestive) heart failure: Secondary | ICD-10-CM

## 2024-02-06 DIAGNOSIS — I428 Other cardiomyopathies: Secondary | ICD-10-CM

## 2024-02-06 MED ORDER — SPIRONOLACTONE 25 MG PO TABS: 25.0000 mg | ORAL_TABLET | Freq: Every day | ORAL | 2 refills | Status: AC

## 2024-02-06 NOTE — Telephone Encounter (Signed)
°*  STAT* If patient is at the pharmacy, call can be transferred to refill team.   1. Which medications need to be refilled? (please list name of each medication and dose if known)   spironolactone  (ALDACTONE ) 25 MG tablet    2. Which pharmacy/location (including street and city if local pharmacy) is medication to be sent to?  [facility] - [place], [place] - [address] [place] [address]    3. Do they need a 30 day or 90 day supply? 90 .

## 2024-02-06 NOTE — Telephone Encounter (Signed)
 Refill sent
